# Patient Record
Sex: Female | Born: 1935 | Race: White | Hispanic: No | Marital: Married | Smoking: Never smoker
Health system: Southern US, Community
[De-identification: ages and names within clinical notes are randomized; demographics above are authoritative.]

## PROBLEM LIST (undated history)

## (undated) DIAGNOSIS — R1033 Periumbilical pain: Secondary | ICD-10-CM

## (undated) DIAGNOSIS — H3530 Unspecified macular degeneration: Secondary | ICD-10-CM

## (undated) DIAGNOSIS — N6019 Diffuse cystic mastopathy of unspecified breast: Secondary | ICD-10-CM

## (undated) DIAGNOSIS — F329 Major depressive disorder, single episode, unspecified: Secondary | ICD-10-CM

## (undated) DIAGNOSIS — M549 Dorsalgia, unspecified: Secondary | ICD-10-CM

## (undated) DIAGNOSIS — R000 Tachycardia, unspecified: Secondary | ICD-10-CM

## (undated) DIAGNOSIS — R42 Dizziness and giddiness: Secondary | ICD-10-CM

## (undated) DIAGNOSIS — C50919 Malignant neoplasm of unspecified site of unspecified female breast: Secondary | ICD-10-CM

## (undated) DIAGNOSIS — F3289 Other specified depressive episodes: Secondary | ICD-10-CM

## (undated) DIAGNOSIS — W010XXA Fall on same level from slipping, tripping and stumbling without subsequent striking against object, initial encounter: Secondary | ICD-10-CM

## (undated) DIAGNOSIS — K5900 Constipation, unspecified: Secondary | ICD-10-CM

## (undated) DIAGNOSIS — K2100 Gastro-esophageal reflux disease with esophagitis, without bleeding: Secondary | ICD-10-CM

## (undated) DIAGNOSIS — J439 Emphysema, unspecified: Secondary | ICD-10-CM

## (undated) DIAGNOSIS — R350 Frequency of micturition: Secondary | ICD-10-CM

## (undated) DIAGNOSIS — M67919 Unspecified disorder of synovium and tendon, unspecified shoulder: Secondary | ICD-10-CM

## (undated) DIAGNOSIS — M25569 Pain in unspecified knee: Secondary | ICD-10-CM

## (undated) DIAGNOSIS — E785 Hyperlipidemia, unspecified: Secondary | ICD-10-CM

## (undated) DIAGNOSIS — R5383 Other fatigue: Secondary | ICD-10-CM

## (undated) DIAGNOSIS — N289 Disorder of kidney and ureter, unspecified: Secondary | ICD-10-CM

## (undated) DIAGNOSIS — I10 Essential (primary) hypertension: Secondary | ICD-10-CM

## (undated) DIAGNOSIS — G479 Sleep disorder, unspecified: Secondary | ICD-10-CM

## (undated) DIAGNOSIS — R32 Unspecified urinary incontinence: Secondary | ICD-10-CM

## (undated) DIAGNOSIS — M719 Bursopathy, unspecified: Secondary | ICD-10-CM

## (undated) DIAGNOSIS — M8448XA Pathological fracture, other site, initial encounter for fracture: Secondary | ICD-10-CM

## (undated) DIAGNOSIS — R5381 Other malaise: Secondary | ICD-10-CM

## (undated) DIAGNOSIS — G309 Alzheimer's disease, unspecified: Secondary | ICD-10-CM

## (undated) DIAGNOSIS — M810 Age-related osteoporosis without current pathological fracture: Secondary | ICD-10-CM

## (undated) DIAGNOSIS — R269 Unspecified abnormalities of gait and mobility: Secondary | ICD-10-CM

## (undated) DIAGNOSIS — F0280 Dementia in other diseases classified elsewhere without behavioral disturbance: Secondary | ICD-10-CM

## (undated) DIAGNOSIS — F1096 Alcohol use, unspecified with alcohol-induced persisting amnestic disorder: Secondary | ICD-10-CM

## (undated) HISTORY — DX: Diffuse cystic mastopathy of unspecified breast: N60.19

## (undated) HISTORY — DX: Alcohol use, unspecified with alcohol-induced persisting amnestic disorder: F10.96

## (undated) HISTORY — DX: Dementia in other diseases classified elsewhere, unspecified severity, without behavioral disturbance, psychotic disturbance, mood disturbance, and anxiety: F02.80

## (undated) HISTORY — DX: Other fatigue: R53.81

## (undated) HISTORY — DX: Frequency of micturition: R35.0

## (undated) HISTORY — DX: Malignant neoplasm of unspecified site of unspecified female breast: C50.919

## (undated) HISTORY — DX: Dorsalgia, unspecified: M54.9

## (undated) HISTORY — DX: Sleep disorder, unspecified: G47.9

## (undated) HISTORY — DX: Other specified depressive episodes: F32.89

## (undated) HISTORY — DX: Bursopathy, unspecified: M71.9

## (undated) HISTORY — DX: Gastro-esophageal reflux disease with esophagitis: K21.0

## (undated) HISTORY — DX: Constipation, unspecified: K59.00

## (undated) HISTORY — DX: Major depressive disorder, single episode, unspecified: F32.9

## (undated) HISTORY — DX: Fall on same level from slipping, tripping and stumbling without subsequent striking against object, initial encounter: W01.0XXA

## (undated) HISTORY — DX: Tachycardia, unspecified: R00.0

## (undated) HISTORY — PX: OTHER SURGICAL HISTORY: SHX169

## (undated) HISTORY — DX: Unspecified macular degeneration: H35.30

## (undated) HISTORY — DX: Periumbilical pain: R10.33

## (undated) HISTORY — DX: Gastro-esophageal reflux disease with esophagitis, without bleeding: K21.00

## (undated) HISTORY — PX: ROTATOR CUFF REPAIR: SHX139

## (undated) HISTORY — DX: Essential (primary) hypertension: I10

## (undated) HISTORY — DX: Alzheimer's disease, unspecified: G30.9

## (undated) HISTORY — DX: Age-related osteoporosis without current pathological fracture: M81.0

## (undated) HISTORY — DX: Pain in unspecified knee: M25.569

## (undated) HISTORY — DX: Other fatigue: R53.83

## (undated) HISTORY — DX: Unspecified urinary incontinence: R32

## (undated) HISTORY — DX: Hyperlipidemia, unspecified: E78.5

## (undated) HISTORY — DX: Disorder of kidney and ureter, unspecified: N28.9

## (undated) HISTORY — DX: Dizziness and giddiness: R42

## (undated) HISTORY — DX: Unspecified abnormalities of gait and mobility: R26.9

## (undated) HISTORY — DX: Pathological fracture, other site, initial encounter for fracture: M84.48XA

## (undated) HISTORY — DX: Emphysema, unspecified: J43.9

## (undated) HISTORY — DX: Unspecified disorder of synovium and tendon, unspecified shoulder: M67.919

---

## 1997-06-28 ENCOUNTER — Ambulatory Visit (HOSPITAL_COMMUNITY): Admission: RE | Admit: 1997-06-28 | Discharge: 1997-06-28 | Payer: Self-pay | Admitting: *Deleted

## 1997-12-15 ENCOUNTER — Ambulatory Visit (HOSPITAL_COMMUNITY): Admission: RE | Admit: 1997-12-15 | Discharge: 1997-12-15 | Payer: Self-pay | Admitting: *Deleted

## 1998-06-10 ENCOUNTER — Ambulatory Visit (HOSPITAL_COMMUNITY): Admission: RE | Admit: 1998-06-10 | Discharge: 1998-06-10 | Payer: Self-pay | Admitting: *Deleted

## 1998-06-28 ENCOUNTER — Ambulatory Visit (HOSPITAL_COMMUNITY): Admission: RE | Admit: 1998-06-28 | Discharge: 1998-06-28 | Payer: Self-pay | Admitting: Obstetrics & Gynecology

## 1998-11-30 ENCOUNTER — Other Ambulatory Visit: Admission: RE | Admit: 1998-11-30 | Discharge: 1998-11-30 | Payer: Self-pay | Admitting: *Deleted

## 1998-12-19 ENCOUNTER — Encounter: Payer: Self-pay | Admitting: *Deleted

## 1998-12-19 ENCOUNTER — Ambulatory Visit (HOSPITAL_COMMUNITY): Admission: RE | Admit: 1998-12-19 | Discharge: 1998-12-19 | Payer: Self-pay | Admitting: *Deleted

## 1998-12-21 ENCOUNTER — Ambulatory Visit (HOSPITAL_COMMUNITY): Admission: RE | Admit: 1998-12-21 | Discharge: 1998-12-21 | Payer: Self-pay | Admitting: *Deleted

## 1998-12-21 ENCOUNTER — Encounter: Payer: Self-pay | Admitting: *Deleted

## 1998-12-27 ENCOUNTER — Encounter: Admission: RE | Admit: 1998-12-27 | Discharge: 1998-12-27 | Payer: Self-pay | Admitting: *Deleted

## 1999-03-20 HISTORY — PX: MASTECTOMY: SHX3

## 1999-03-27 ENCOUNTER — Ambulatory Visit (HOSPITAL_COMMUNITY): Admission: RE | Admit: 1999-03-27 | Discharge: 1999-03-27 | Payer: Self-pay | Admitting: *Deleted

## 1999-04-04 ENCOUNTER — Encounter: Admission: RE | Admit: 1999-04-04 | Discharge: 1999-05-10 | Payer: Self-pay | Admitting: Orthopaedic Surgery

## 1999-06-29 ENCOUNTER — Encounter: Payer: Self-pay | Admitting: *Deleted

## 1999-06-29 ENCOUNTER — Encounter: Admission: RE | Admit: 1999-06-29 | Discharge: 1999-06-29 | Payer: Self-pay | Admitting: Internal Medicine

## 1999-12-04 ENCOUNTER — Other Ambulatory Visit: Admission: RE | Admit: 1999-12-04 | Discharge: 1999-12-04 | Payer: Self-pay | Admitting: *Deleted

## 2000-01-01 ENCOUNTER — Encounter: Admission: RE | Admit: 2000-01-01 | Discharge: 2000-01-01 | Payer: Self-pay | Admitting: *Deleted

## 2000-01-01 ENCOUNTER — Encounter: Payer: Self-pay | Admitting: *Deleted

## 2000-01-15 ENCOUNTER — Encounter: Admission: RE | Admit: 2000-01-15 | Discharge: 2000-01-15 | Payer: Self-pay | Admitting: *Deleted

## 2000-01-15 ENCOUNTER — Encounter: Payer: Self-pay | Admitting: *Deleted

## 2000-01-15 ENCOUNTER — Other Ambulatory Visit: Admission: RE | Admit: 2000-01-15 | Discharge: 2000-01-15 | Payer: Self-pay | Admitting: *Deleted

## 2000-01-15 ENCOUNTER — Encounter (INDEPENDENT_AMBULATORY_CARE_PROVIDER_SITE_OTHER): Payer: Self-pay | Admitting: *Deleted

## 2000-01-29 ENCOUNTER — Encounter: Payer: Self-pay | Admitting: *Deleted

## 2000-01-31 ENCOUNTER — Encounter (INDEPENDENT_AMBULATORY_CARE_PROVIDER_SITE_OTHER): Payer: Self-pay | Admitting: Specialist

## 2000-02-01 ENCOUNTER — Inpatient Hospital Stay (HOSPITAL_COMMUNITY): Admission: EM | Admit: 2000-02-01 | Discharge: 2000-02-02 | Payer: Self-pay | Admitting: *Deleted

## 2000-03-05 ENCOUNTER — Observation Stay (HOSPITAL_COMMUNITY): Admission: RE | Admit: 2000-03-05 | Discharge: 2000-03-06 | Payer: Self-pay | Admitting: *Deleted

## 2000-03-05 ENCOUNTER — Encounter (INDEPENDENT_AMBULATORY_CARE_PROVIDER_SITE_OTHER): Payer: Self-pay

## 2000-06-26 ENCOUNTER — Ambulatory Visit (HOSPITAL_COMMUNITY): Admission: RE | Admit: 2000-06-26 | Discharge: 2000-06-26 | Payer: Self-pay | Admitting: Gastroenterology

## 2000-10-02 ENCOUNTER — Encounter: Admission: RE | Admit: 2000-10-02 | Discharge: 2000-10-16 | Payer: Self-pay | Admitting: Oncology

## 2000-10-17 ENCOUNTER — Encounter: Admission: RE | Admit: 2000-10-17 | Discharge: 2000-10-23 | Payer: Self-pay | Admitting: Oncology

## 2000-12-02 ENCOUNTER — Encounter: Admission: RE | Admit: 2000-12-02 | Discharge: 2000-12-02 | Payer: Self-pay | Admitting: Oncology

## 2000-12-02 ENCOUNTER — Encounter: Payer: Self-pay | Admitting: Oncology

## 2000-12-23 ENCOUNTER — Other Ambulatory Visit: Admission: RE | Admit: 2000-12-23 | Discharge: 2000-12-23 | Payer: Self-pay | Admitting: *Deleted

## 2001-12-03 ENCOUNTER — Encounter: Admission: RE | Admit: 2001-12-03 | Discharge: 2001-12-03 | Payer: Self-pay | Admitting: Oncology

## 2001-12-03 ENCOUNTER — Encounter: Payer: Self-pay | Admitting: Oncology

## 2002-01-09 ENCOUNTER — Other Ambulatory Visit: Admission: RE | Admit: 2002-01-09 | Discharge: 2002-01-09 | Payer: Self-pay | Admitting: *Deleted

## 2003-01-21 ENCOUNTER — Other Ambulatory Visit: Admission: RE | Admit: 2003-01-21 | Discharge: 2003-01-21 | Payer: Self-pay | Admitting: *Deleted

## 2003-10-11 ENCOUNTER — Emergency Department (HOSPITAL_COMMUNITY): Admission: EM | Admit: 2003-10-11 | Discharge: 2003-10-11 | Payer: Self-pay | Admitting: Family Medicine

## 2003-10-20 ENCOUNTER — Emergency Department (HOSPITAL_COMMUNITY): Admission: EM | Admit: 2003-10-20 | Discharge: 2003-10-20 | Payer: Self-pay

## 2003-12-06 ENCOUNTER — Encounter: Admission: RE | Admit: 2003-12-06 | Discharge: 2003-12-06 | Payer: Self-pay | Admitting: Oncology

## 2003-12-14 ENCOUNTER — Ambulatory Visit (HOSPITAL_COMMUNITY): Admission: RE | Admit: 2003-12-14 | Discharge: 2003-12-14 | Payer: Self-pay | Admitting: Oncology

## 2004-03-29 ENCOUNTER — Ambulatory Visit: Payer: Self-pay | Admitting: Oncology

## 2004-04-03 ENCOUNTER — Ambulatory Visit (HOSPITAL_COMMUNITY): Admission: RE | Admit: 2004-04-03 | Discharge: 2004-04-03 | Payer: Self-pay | Admitting: Oncology

## 2004-09-26 ENCOUNTER — Ambulatory Visit: Payer: Self-pay | Admitting: Oncology

## 2004-09-29 ENCOUNTER — Ambulatory Visit (HOSPITAL_COMMUNITY): Admission: RE | Admit: 2004-09-29 | Discharge: 2004-09-29 | Payer: Self-pay | Admitting: Oncology

## 2005-01-19 ENCOUNTER — Ambulatory Visit: Payer: Self-pay | Admitting: Oncology

## 2005-01-31 ENCOUNTER — Ambulatory Visit (HOSPITAL_COMMUNITY): Admission: RE | Admit: 2005-01-31 | Discharge: 2005-01-31 | Payer: Self-pay | Admitting: Oncology

## 2005-06-13 ENCOUNTER — Ambulatory Visit (HOSPITAL_COMMUNITY): Admission: RE | Admit: 2005-06-13 | Discharge: 2005-06-13 | Payer: Self-pay | Admitting: Internal Medicine

## 2005-06-14 ENCOUNTER — Ambulatory Visit: Admission: RE | Admit: 2005-06-14 | Discharge: 2005-06-14 | Payer: Self-pay | Admitting: Internal Medicine

## 2005-10-04 ENCOUNTER — Other Ambulatory Visit: Admission: RE | Admit: 2005-10-04 | Discharge: 2005-10-04 | Payer: Self-pay | Admitting: Obstetrics & Gynecology

## 2006-02-01 ENCOUNTER — Ambulatory Visit (HOSPITAL_COMMUNITY): Admission: RE | Admit: 2006-02-01 | Discharge: 2006-02-01 | Payer: Self-pay | Admitting: Pediatrics

## 2006-02-18 ENCOUNTER — Ambulatory Visit (HOSPITAL_COMMUNITY): Admission: RE | Admit: 2006-02-18 | Discharge: 2006-02-18 | Payer: Self-pay | Admitting: Pediatrics

## 2008-05-13 ENCOUNTER — Ambulatory Visit: Payer: Self-pay | Admitting: Oncology

## 2008-05-17 LAB — CBC WITH DIFFERENTIAL/PLATELET
BASO%: 0.3 % (ref 0.0–2.0)
Basophils Absolute: 0 10*3/uL (ref 0.0–0.1)
EOS%: 1.5 % (ref 0.0–7.0)
Eosinophils Absolute: 0.1 10*3/uL (ref 0.0–0.5)
MONO#: 0.6 10*3/uL (ref 0.1–0.9)
MONO%: 7.1 % (ref 0.0–14.0)
NEUT%: 76 % (ref 38.4–76.8)
RDW: 12.8 % (ref 11.2–14.5)
WBC: 8.4 10*3/uL (ref 3.9–10.3)

## 2008-05-17 LAB — COMPREHENSIVE METABOLIC PANEL
Albumin: 4.4 g/dL (ref 3.5–5.2)
Alkaline Phosphatase: 56 U/L (ref 39–117)
CO2: 26 mEq/L (ref 19–32)
Calcium: 9.4 mg/dL (ref 8.4–10.5)
Glucose, Bld: 105 mg/dL — ABNORMAL HIGH (ref 70–99)
Total Protein: 6.7 g/dL (ref 6.0–8.3)

## 2008-11-01 ENCOUNTER — Ambulatory Visit (HOSPITAL_COMMUNITY): Admission: RE | Admit: 2008-11-01 | Discharge: 2008-11-01 | Payer: Self-pay | Admitting: Internal Medicine

## 2008-11-03 ENCOUNTER — Encounter: Admission: RE | Admit: 2008-11-03 | Discharge: 2008-11-03 | Payer: Self-pay | Admitting: Internal Medicine

## 2010-04-09 ENCOUNTER — Encounter: Payer: Self-pay | Admitting: Internal Medicine

## 2010-08-04 NOTE — Discharge Summary (Signed)
[facility]  Patient:    Shelby Rodriguez, [facility]                          MRN: [id number] Adm. Date:  01/31/00 Disc. Date: 02/02/00 Attending:  Donnie Coffin. Samuella Cota, M.D. CC:         Richard A. Jacky Kindle, M.D.  Andres Ege, M.D.  Valentino Hue. Magrinat, M.D.   Discharge Summary  CCS#:  [id number]  CHIEF COMPLAINT:  Multicentric ductal carcinoma in situ, left breast.  HISTORY OF PRESENT ILLNESS:  This 77 year old white female had her regular mammograms throughout the years. She had a mammogram of the left breast only on June 29, 1999. There were some calcifications in the left breast. There was no change from a study of December 19, 1998. She then had bilateral mammograms on January 01, 2000. They now describe a new area in the subareolar area with increased number in ______ medially. Dr. Doloris Hall recommended biopsy which could include imaging guided biopsy or an excisional biopsy. The patients had numerous cysts aspirated throughout the years by Dr. Judithe Modest. She has never had any breast biopsies. The patient had felt nothing new in her breast. Her menarche was at age 83, her first pregnancy at age 56. She breast fed her first child for 2-3 months. She went through menopause at about age 21 and has been on hormone replacement for 11 years. There is no family history of any breast malignancy. The patient underwent large core needle biopsy of 2 areas in the left breast and both of these showed ductal carcinoma in situ with no invasive carcinoma. It was felt that because this was multicentric that a total mastectomy was indicated since there was no evidence of invasive disease. There was no plan to do a sentinel lymph node biopsy. The patient is scheduled now for that surgery. The patient is not interested in breast reconstruction at that time.  PAST MEDICAL HISTORY:  ALLERGIES:  NOVOCAINE made her faint but no rash.  MEDICATIONS:  Premarin 0.625 mg q.d., Provera  2.5 mg q.d. which she has now discontinued since she was diagnosed with DCIS. Levatal 20 mg daily for hypertension, calcium 1500 mg q.d. plus vitamin D, vitamin E, B12, folic acid 1 coated aspirin 81 mg per day, also uses a stool softener.  PAST SURGICAL HISTORY:  Surgery on her left elbow, has also had bilateral carpal tunnel surgery, tonsillectomy at age 77.  SERIOUS ILLNESSES:  See above.  REVIEW OF SYSTEMS:  Wears glasses and is restrictive on drivers license. Does not smoke and has never smoked. There is no history of any heart disease. Bowel elimination usually once a day. Drinks an occasional alcoholic beverage. The patient has seen Dr. Darvin Neighbours in the past with about 1 urinary tract infection a year. She apparently has been cystoscoped and has nocturia x 3-4.  MARITAL HISTORY:  Her husband, Annette Stable, is 75 years old. They have 1 son and 1 daughter.  PHYSICAL EXAMINATION:  VITAL SIGNS:  Weight 127 pounds.  GENERAL:  The patient is a well-developed, well-nourished, pleasant, white female in no acute distress.  PERTINENT FINDINGS RELATED TO THE BREAST:  The left breast is moderate size, no definite mass felt in the breast although there are some fibrocystic changes in the upper outer quadrant. The patient does have a palpable left axillary lymph node which is nontender but freely moveable and probably 1.5 cm in size.  IMPRESSION:  Multicentric ductal carcinoma in situ,  left breast.  HOSPITAL COURSE:  The patient was taken to the operating room on January 31, 2000 at which time she had a left total mastectomy. There was at least 1 lymph node in the low axilla which was removed with the specimen. The patient tolerated the procedure well and was taken to the recovery room but shortly after reaching the recovery room, a large hematoma of the wound was noted and the patient was immediately taken back to the operating room where the hematoma was evacuated, the wound was copiously  irrigated and there was no bleeding site noted. The drains were replaced and the patient was placed in a pressure dressing with a 6 inch Ace bandage wrapped around her chest. Postoperatively, her hemoglobin was 9.9 with hematocrit 28 but the first postoperative day her hemoglobin was down to 7.6 with a hematocrit of 21.9. It was felt that she would probably continue to lose some blood and at this level she needed blood transfusions. This was discussed with the patient and she was then given 2 units of packed red blood cells. Her hemoglobin increased to 11.2 with hematocrit of 32.5 on the second postoperative day and she was discharged home improved. The patient was to be seen back in the office in 4-5 days.  Laboratory data revealed initial hemoglobin of 13.2, her last hemoglobin was 11.2. White count initially was 13,900 and her last was 8000. EKG showed normal sinus rhythm, normal EKG. Chest x-ray was unremarkable. Final pathology report showed multifocal invasive lobular carcinoma with multifocal ductal and lobular carcinoma in situ. She had 2 areas of invasive lobular carcinoma, one measuring 1.1 cm the other 0.3 cm. There was 1 lymph node 1.7 cm in size which was identified and there was no evidence of metastatic tumor in that lymph node. The patient was estrogen receptor positive, progestin receptor slightly positive, proliferative marker low at 2%. Her ______ was 3+ positive. The left breast carcinoma was strongly positive for estrogen receptor and weakly positive for progesterone receptor. The tumor is DNA diploid and has a low proliferation. There is strong over expression of her ______ detection.  OPERATIONS:  January 31, 2000, left total mastectomy.  POSTOPERATIVE COMPLICATIONS:  Bleeding. The patient was returned to the operating room on January 31, 2000 for evacuation of hematoma and control of bleeding.   CONDITION ON DISCHARGE:  Improved.  DISCHARGE MEDICATIONS:   Vicodin 1 p.o. q. 4h p.r.n., ferrous gluconate 300 mg p.o. b.i.d. for 15 days. The patient will be seen back in the office for follow-up in 4-5 days. DD:  02/19/00 TD:  02/19/00 Job: [id number]

## 2010-08-04 NOTE — Procedures (Signed)
[facility]  Patient:    Shelby Rodriguez, Shelby Rodriguez                        MRN: [id number] Proc. Date: 06/26/00 Adm. Date:  [id number] Attending:  Rich Brave CC:         Richard A. Jacky Kindle, M.D.             Valentino Hue. Magrinat, M.D.             Andres Ege, M.D.                           Procedure Report  PROCEDURE:  Colonoscopy.  INDICATION:  A 77 year old female with history of breast cancer, for colon cancer screening.  Her only symptom is chronic, stable constipation. FINDINGS:  Moderate diverticulosis and mild melanosis coli.  INFORMED CONSENT:  The nature, purpose, and risks of the procedure have been discussed with the patient who provided written consent.  SEDATION:  Fentanyl 50 mcg and Versed 4 mg IV without arrhythmias or desaturation.  DESCRIPTION OF PROCEDURE:  The Olympus adult video colonoscope was advanced through a somewhat fixated and tortuous sigmoid region to the cecum as identified by clear visualization of the appendiceal orifice, after which pullback was performed in a gradual fashion.  The quality of the prep was excellent, and it is felt that all areas were well seen.  No polyps, cancer, colitis, or vascular malformations were observed during this exam.  The patient did have moderate sigmoid diverticulosis with associated fixation of the colon, as well as an isolated right-sided diverticulum a short distance above the cecum.  There was also a mild amount of melanosis coli.  Retroflexion of the rectum was normal.  Pullout through the anal canal demonstrated mild to moderate internal hemorrhoids.  No biopsies were obtained.  The patient tolerated the procedure well, and there were no apparent complications.  IMPRESSION: 1. No evidence of neoplastic lesions. 2. Moderate diverticulosis. 3. Melanosis coli implying previous exposure to senna-type laxatives.  PLAN:  Flexible sigmoidoscopy in five years with  follow-up colonoscopy in about 10 years if the patient remains in good general medical health in the interim. DD:  06/26/00 TD:  06/26/00 Job: [id number]

## 2010-08-04 NOTE — Op Note (Signed)
. [facility]  Patient:    Shelby Rodriguez, [facility]                          MRN: [id number] Proc. Date: 01/31/00 Adm. Date:  [id number] Attending:  Kandis Mannan CC:         Richard A. Jacky Kindle, M.D.  Andres Ege, M.D.  Thomas B. Samuella Cota, M.D.   Operative Report  CCS# [id number]  PREOPERATIVE DIAGNOSIS:  Multicentric ductal carcinoma in situ, left breast.  POSTOPERATIVE DIAGNOSIS:  Multicentric ductal carcinoma in situ, left breast.  OPERATION PERFORMED:  Left total mastectomy.  SURGEON:  Maisie Fus B. Samuella Cota, M.D.  ASSISTANT:  Gita Kudo, M.D.  ANESTHESIA:  General.  ANESTHESIOLOGIST:  Dr. Almeta Monas and CRNA.  DESCRIPTION OF PROCEDURE:  The patient was taken to the operating room and placed on the table in supine position.  After satisfactory general anesthetic with intubation, the left breast and axilla was prepped and draped as a sterile field.  A transverse elliptical incision was outlined so as to remove the nipple and areola and also the site of the two large core needle biopsies in the lower portion of the breast.  The incision was started about 4 cm lateral to the midline, extended to the anterior axillary line.  Skin flaps were then developed superiorly to the clavicle, inferiorly to the fascia overlying the rectus muscle, medially to the sternum and laterally to the latissimus dorsi muscle.  After the skin flaps were developed, the breast was removed from medial to lateral, removing the breast but leaving the pectoralis major and minor muscles.  The lower portion of the axilla was cut across and at least one lymph node which felt benign was in the specimen.  No attempt ws made to identify the long thoracic nerve or the thoracodorsal artery, vein and nerve.  The wound was copiously irrigated.  Hemostasis was obtained using the cautery or with ties of 3-0 Vicryl.  Two #10 round Blake drains were placed through separate stab wounds inferior  to the transverse incision.  One was placed in the axilla and one was placed over the chest wall medially.  They were sutured to the skin using 3-0 nylon.  The subcutaneous tissues were then approximated with interrupted sutures of 3-0 Vicryl and the skin was closed with skin staples.  Vaseline gauze was placed over the suture line and around the drains.  The Blake drains were connected to bulb suction.  The dressing was completed using 4 x 4s, ABDs and 4-inch Hypafix.  The patient seemed to tolerate the procedure well and was taken to the PACU in satisfactory condition. DD:  01/31/00 TD:  01/31/00 Job: [id number]

## 2010-08-04 NOTE — Op Note (Signed)
[facility]  Patient:    Shelby Rodriguez, Shelby Rodriguez                          MRN: [id number] Proc. Date: 03/05/00 Adm. Date:  [id number] Attending:  Kandis Mannan CC:         Richard A. Jacky Kindle, M.D.  Andres Ege, M.D.  Valentino Hue. Magrinat, M.D.   Operative Report  CCS NUMBER: [id number]  PREOPERATIVE DIAGNOSIS: Multicentric invasive lobular carcinoma, left breast.  POSTOPERATIVE DIAGNOSIS: Multicentric invasive lobular carcinoma, left breast.  OPERATION: 1. Axillary lymph node dissection. 2. Right total mastectomy.  SURGEON:  Maisie Fus B. Samuella Cota, M.D.  ASSISTANT:  Zigmund Daniel, M.D.  ANESTHESIA:  General.  ANESTHESIOLOGIST:  Mack Guise, M.D. and CRNA  DESCRIPTION OF PROCEDURE:  The patient was taken to the operating room and placed on the table in supine position.  After satisfactory general anesthesia with LMA intubation, the left chest and axilla and the right breast and axilla were prepped and draped in a sterile field.  The patient had a previous left total mastectomy, and the old incision was extended towards the axilla.  Skin flaps were developed and the axilla entered.  The patient had a seroma from her previous surgery, and it was a smooth-lined cyst.  The dissection revealed the axillary vein.  The patient did have some easily seen and palpable lymph nodes, but they felt fairly soft and hopefully were inflammatory nodes from the recent surgery.  The lateral thoracic nerve ______   and ______ artery, vein, and nerve were seen and preserved.  The vessels coming out inferior to the axillary vein were clipped and divided.  The axilla was dissected and the content removed.  The wound was irrigated, hemostasis obtained.  A #19 round Blake drain was placed through the previously made stab site laterally over the left chest.  This was placed in the axilla.  The skin was then closed with skin staples.  The drain was sutured to the skin using a  3-0 nylon.  The surgeons then closed gloves, and we used clean instruments on the right side.  A transverse elliptical incision was outlined for the total mastectomy.  The incision began about 4 cm from the midline, extended to the anterior axillary line.  Since the patient was not planning a reconstruction, a fairly generous amount of skin was removed.  Skin flaps were developed superior to the ______ medial to the sternum inferior to the place overlying the rectus muscle and lateral to the latissimus dorsi muscle.  The breast was then removed leaving the pectoralis major and minor muscles.  No attempt was made to dissect the axilla.  Bleeders were clamped and suture ligated with 3-0 Vicryl or cauterized with Bovie.  Two #19 round Blake drains were placed, one medially and one laterally.  These were sutured to the skin using 3-0 nylon. Subcutaneous tissue was closed with 3-0 Vicryl.  Skin was closed with skin staples.  All three drains were connected to bulb suction.  Vaseline gauze was placed over the suture line and around the drains; 4 x 4s, ABD, and 4 inch Hypafix were used to complete the dressing.  The patient seemed to tolerate the procedure well, although her blood pressure was running high during a good bit of the procedure.  The patient was taken to the PACU in satisfactory condition. DD:  03/05/00 TD:  03/06/00 Job: [id number]

## 2010-08-04 NOTE — Op Note (Signed)
[facility]  Patient:    Shelby Rodriguez, Shelby Rodriguez [facility]                          MRN: [id number] Proc. Date: 01/31/00 Adm. Date:  [id number] Attending:  Kandis Mannan                           Operative Report  CCS NUMBER:  [phone]  PREOPERATIVE DIAGNOSIS:  Postoperative hematoma of wound.  POSTOPERATIVE DIAGNOSIS:  Postoperative hematoma of wound.  OPERATION:  Exploration of wound and evacuation of hematoma.  SURGEON:  Maisie Fus B. Samuella Cota, M.D.  ASSISTANT:  Lorne Skeens. Hoxworth, M.D.  ANESTHESIA:  General.  ANESTHESIOLOGIST:  CRNA.  DESCRIPTION OF PROCEDURE:  The patient was taken to the operating room and placed on the table in the supine position.  After satisfactory general anesthetic with intubation, the left chest and breast area was prepped after the two Blake tubes had been removed.  The area was prepped widely.  The skin staples were removed and the subcuticular 3-0 Vicryl sutures removed.  The patient had a large hematoma of the wound which was evacuated.  The exploration revealed no obvious large bleeding vessel.  There were several very small areas which seemed to be oozing somewhat.  It was copiously irrigated with saline.  After watching the wound for quite some time, no obvious bleeding was noted. The patient had two more #18 Blake drains placed through the same stab wounds in the inferior part of the chest.  The drains were sutured to the skin using 3-0 nylon.  The skin was then closed with skin staples.  Vaseline gauze was placed over the suture line along with the drain sites.  They were connected to bulb suction.  The patients chest was then wrapped with a 6 inch Ace bandage to get more pressure on the wound.  The patient seemed to tolerate the procedure well and was taken to the PACU in satisfactory condition. DD:  01/31/00 TD:  01/31/00 Job: [id number]

## 2011-04-30 ENCOUNTER — Encounter: Payer: Self-pay | Admitting: Gastroenterology

## 2011-05-16 ENCOUNTER — Ambulatory Visit: Payer: Self-pay | Admitting: Gastroenterology

## 2011-07-02 ENCOUNTER — Other Ambulatory Visit: Payer: Self-pay | Admitting: Internal Medicine

## 2011-07-06 ENCOUNTER — Ambulatory Visit
Admission: RE | Admit: 2011-07-06 | Discharge: 2011-07-06 | Disposition: A | Discharge status: Home / Self Care | Payer: Self-pay | Source: Ambulatory Visit | Attending: Internal Medicine | Admitting: Internal Medicine

## 2011-07-06 MED ORDER — IOHEXOL 300 MG/ML  SOLN
100.0000 mL | Freq: Once | INTRAMUSCULAR | Status: AC | PRN
  Administered 2011-07-06: 100 mL via INTRAVENOUS

## 2012-06-16 LAB — CBC AND DIFFERENTIAL
Hemoglobin: 12.4 g/dL (ref 12.0–16.0)
Platelets: 191 10*3/uL (ref 150–399)

## 2012-06-16 LAB — BASIC METABOLIC PANEL
BUN: 17 mg/dL (ref 4–21)
Creatinine: 1 mg/dL (ref 0.5–1.1)
Glucose: 85 mg/dL

## 2012-06-19 ENCOUNTER — Non-Acute Institutional Stay (SKILLED_NURSING_FACILITY): Payer: Medicare Other | Admitting: Nurse Practitioner

## 2012-06-19 DIAGNOSIS — D649 Anemia, unspecified: Secondary | ICD-10-CM

## 2012-06-19 DIAGNOSIS — F0280 Dementia in other diseases classified elsewhere without behavioral disturbance: Secondary | ICD-10-CM

## 2012-06-19 DIAGNOSIS — I10 Essential (primary) hypertension: Secondary | ICD-10-CM

## 2012-06-19 DIAGNOSIS — K5900 Constipation, unspecified: Secondary | ICD-10-CM

## 2012-06-19 DIAGNOSIS — R634 Abnormal weight loss: Secondary | ICD-10-CM

## 2012-06-19 DIAGNOSIS — G309 Alzheimer's disease, unspecified: Secondary | ICD-10-CM

## 2012-06-19 DIAGNOSIS — E785 Hyperlipidemia, unspecified: Secondary | ICD-10-CM

## 2012-06-19 DIAGNOSIS — F329 Major depressive disorder, single episode, unspecified: Secondary | ICD-10-CM

## 2012-06-19 NOTE — Progress Notes (Signed)
Subjective:    Patient ID: Shelby Rodriguez, female    DOB: 12-12-1929, 79 y.o.   MRN: [id number]  HPI  HYPERLIPIDEMIA  LDL 144 02/25/12, takes Atorvastatin 20mg  ANEMIA  stable with  Hgb 12.5 03/10/12,  not on Iron or B12 DEPRESSIVE DISORDER NEC  stable on Mirtazapine 15mg  ALZHEIMER'S The Alzheimer's disease has not changed and the patient continues to function adequately in the current living environment with supervision. The patient has had little change in behavior.  No complications noted from the medication presently being used.Continues to progress despite Exelon and Namenda. Sees Dr. Sharene Skeans regularly.  MMSE 5 out of 30 on January 2012 per Dr. Constance Haw progress note. HTN UNSPECIFIED The blood pressure readings taken outside the office since the last visit have been in the target range.Takes Metoprolol 25mg  bid. 118/64 CONSTIPATION  stable on Senna WEIGHT LOSS, ABN  stabilized--on Zofran and Prilosec routinely for N/V per GI    Review of Systems  Constitutional: Negative for fever, chills, diaphoresis, appetite change, fatigue and unexpected weight change.  HENT: Positive for hearing loss. Negative for congestion, rhinorrhea, trouble swallowing, neck pain, voice change, postnasal drip and ear discharge.   Eyes: Negative.   Respiratory: Negative for cough, choking, chest tightness, shortness of breath and wheezing.   Cardiovascular: Negative for chest pain, palpitations and leg swelling.  Gastrointestinal: Negative for nausea, vomiting, abdominal pain, diarrhea, constipation and abdominal distention.  Endocrine: Negative for cold intolerance, heat intolerance, polydipsia, polyphagia and polyuria.  Genitourinary: Positive for frequency (urinary incontinence). Negative for dysuria, urgency, flank pain and pelvic pain.  Musculoskeletal: Positive for back pain, arthralgias and gait problem (walker). Negative for joint swelling.  Skin: Positive for rash. Negative for wound.   Allergic/Immunologic: Negative.   Neurological: Negative for tremors, seizures, syncope, speech difficulty, light-headedness, numbness and headaches.  Hematological: Negative.   Psychiatric/Behavioral: Positive for confusion. Negative for hallucinations, behavioral problems, sleep disturbance, dysphoric mood and agitation.       Objective:   Physical Exam  Constitutional: She appears well-developed and well-nourished.  HENT:  Head: Normocephalic and atraumatic.  Eyes: Conjunctivae and EOM are normal. Pupils are equal, round, and reactive to light.  Neck: Normal range of motion. Neck supple. No JVD present. No thyromegaly present.  Cardiovascular: Normal rate and regular rhythm.   No murmur heard. Pulmonary/Chest: Effort normal and breath sounds normal. No respiratory distress. She has no wheezes. She has no rales. She exhibits no tenderness.  Abdominal: Soft. Bowel sounds are normal. There is no tenderness.  Musculoskeletal: Normal range of motion. She exhibits no edema and no tenderness.  Lymphadenopathy:    She has no cervical adenopathy.  Neurological: She is alert. She has normal reflexes. She displays normal reflexes. No cranial nerve deficit. She exhibits normal muscle tone. Coordination normal.  Skin: Skin is warm and dry. No rash noted.  Psychiatric: Her mood appears not anxious. Her affect is inappropriate. Her affect is not angry and not blunt. Her speech is delayed and tangential. She is slowed. She is not agitated, not aggressive and not combative. Thought content is not paranoid and not delusional. Cognition and memory are impaired. She expresses impulsivity and inappropriate judgment. She does not exhibit a depressed mood. She exhibits abnormal recent memory and abnormal remote memory.     LABS REVIEWED:  10/01/11 CBC wbc 5.2 Hgb 12.3, Hct 36.7, plt 300   BMP Na 141, K 4.1, glucose 80, Bun 16, creatinine 1.40, Ca 9.8   TSH 1.545 11/26/11  lipid panel, cholesterol  315  Triglycerides 128 HDL 56 LDL 233 12/27/11 CBC wbc 5.2, Hgb 11.7, Hct 34.4, plt 252   CMP Na 142, K 4.7, glucose 63, Bun 22, creatinine 1.37, Ca 9.5, LFT wnl   TSH 1.331.  02/25/12  Lipid cholesterol 234, triglycerides 145, HDL 61, LDL 29 03/09/12  UA E-Coli treated with Cipro for total 10 days per urine culture.  03/10/12  CBC wbc 6.8, Hgb 12.5, Hct 38.5, plt 277   BMP Na 142, K 4.4, glucose 85, Bun 29, creatinine 1.49, Ca 10.0 03/27/12    UA enterococcus >100,000c/ml treated with Ampicillin.  04/06/12  UA no growth.  04/24/12   BMP Na 139, K 4.1, glucose 84, Bun 28, creatinine 1.33, Ca 9.4      Assessment & Plan:   . Other and unspecified hyperlipidemia  continue Atorvastatin 20mg    . Anemia, unspecified Update CBC  . Depressive disorder, not elsewhere classified Stable, continue Remeron 7.5mg    . Alzheimer's disease Progressing, check TSH  . Unspecified essential hypertension Controlled on Metoprolol   . Unspecified constipation Stable.   . Loss of weight  Stabilized.

## 2012-06-20 DIAGNOSIS — D649 Anemia, unspecified: Secondary | ICD-10-CM | POA: Insufficient documentation

## 2012-06-20 DIAGNOSIS — K5900 Constipation, unspecified: Secondary | ICD-10-CM | POA: Insufficient documentation

## 2012-06-20 DIAGNOSIS — R634 Abnormal weight loss: Secondary | ICD-10-CM | POA: Insufficient documentation

## 2012-06-20 DIAGNOSIS — F329 Major depressive disorder, single episode, unspecified: Secondary | ICD-10-CM | POA: Insufficient documentation

## 2012-06-20 DIAGNOSIS — F0280 Dementia in other diseases classified elsewhere without behavioral disturbance: Secondary | ICD-10-CM | POA: Insufficient documentation

## 2012-06-20 DIAGNOSIS — I10 Essential (primary) hypertension: Secondary | ICD-10-CM | POA: Insufficient documentation

## 2012-06-20 DIAGNOSIS — E785 Hyperlipidemia, unspecified: Secondary | ICD-10-CM | POA: Insufficient documentation

## 2012-06-23 LAB — CBC AND DIFFERENTIAL
HCT: 34 % — AB (ref 36–46)
Hemoglobin: 11.2 g/dL — AB (ref 12.0–16.0)
Platelets: 265 10*3/uL (ref 150–399)

## 2012-06-24 ENCOUNTER — Encounter: Payer: Self-pay | Admitting: Nurse Practitioner

## 2012-06-24 ENCOUNTER — Non-Acute Institutional Stay (SKILLED_NURSING_FACILITY): Payer: Medicare Other | Admitting: Nurse Practitioner

## 2012-06-24 DIAGNOSIS — K5900 Constipation, unspecified: Secondary | ICD-10-CM

## 2012-06-24 DIAGNOSIS — G309 Alzheimer's disease, unspecified: Secondary | ICD-10-CM

## 2012-06-24 DIAGNOSIS — F0280 Dementia in other diseases classified elsewhere without behavioral disturbance: Secondary | ICD-10-CM

## 2012-06-24 DIAGNOSIS — F329 Major depressive disorder, single episode, unspecified: Secondary | ICD-10-CM

## 2012-06-24 DIAGNOSIS — I10 Essential (primary) hypertension: Secondary | ICD-10-CM

## 2012-06-24 DIAGNOSIS — R000 Tachycardia, unspecified: Secondary | ICD-10-CM | POA: Insufficient documentation

## 2012-06-24 NOTE — Assessment & Plan Note (Signed)
Continue to f/u Neurology and Exelon 9.5mg /24hrs transdermal patch and Namenda 5mg  bid

## 2012-06-24 NOTE — Assessment & Plan Note (Signed)
Stable on Mirtazapine 15mg 

## 2012-06-24 NOTE — Progress Notes (Signed)
Patient ID: Shelby Rodriguez, female   DOB: Feb 28, 1930, 79 y.o.   MRN: [id number]  Chief Complaint: Chief Complaint  Patient presents with  . Medical Managment of Chronic Issues and c/o vaginal discharge.      HPI:   Staff reported the patient has greenish vaginal discharge 06/18/12 when assisted the patient with her incontinent care. No further vaginal discharge noted by other staff since then. The patient denied urogenital/vaginal area itching, pain, irritation, or dysuria today upon my visit.   Review of Systems:  Review of Systems  Constitutional: Negative for fever, chills, weight loss, malaise/fatigue and diaphoresis.  HENT: Negative for hearing loss, congestion, sore throat and neck pain.   Eyes: Negative for blurred vision, double vision, photophobia, pain, discharge and redness.  Respiratory: Negative for cough, sputum production, shortness of breath and wheezing.   Cardiovascular: Negative for chest pain, palpitations, orthopnea, claudication, leg swelling and PND.  Gastrointestinal: Positive for nausea (has been better since Zofran 4mg  with meals. ). Negative for heartburn, vomiting, abdominal pain, diarrhea and constipation.  Genitourinary: Positive for frequency (incontinent of bladder. ). Negative for dysuria, urgency and flank pain.  Musculoskeletal: Negative for back pain and joint pain.  Skin: Negative for rash.  Neurological: Negative for dizziness, tremors, focal weakness, seizures, loss of consciousness, weakness and headaches.  Endo/Heme/Allergies: Negative for environmental allergies and polydipsia. Does not bruise/bleed easily.  Psychiatric/Behavioral: Positive for memory loss. Negative for depression and hallucinations. The patient is not nervous/anxious and does not have insomnia.      Medications: Reviewed at [facility]   Physical Exam: Physical Exam  Constitutional: She appears well-developed and well-nourished. No distress.  HENT:  Head: Normocephalic and  atraumatic.  Eyes: EOM are normal. Pupils are equal, round, and reactive to light. Right eye exhibits no discharge. Left eye exhibits no discharge.  Neck: Normal range of motion. Neck supple. No JVD present. No thyromegaly present.  Cardiovascular: Normal rate and regular rhythm.   No murmur heard. Pulmonary/Chest: Effort normal and breath sounds normal. She has no wheezes. She has no rales.  Abdominal: Soft. Bowel sounds are normal. She exhibits no distension. There is no tenderness.  Genitourinary: Rectal exam shows no external hemorrhoid, no internal hemorrhoid, no fissure, no mass and no tenderness. Pelvic exam was performed with patient supine. No labial fusion. There is no rash, tenderness, lesion or injury on the right labia. There is no rash, tenderness, lesion or injury on the left labia. No erythema, tenderness or bleeding around the vagina. No foreign body around the vagina. No signs of injury around the vagina. No vaginal discharge found.  Lymphadenopathy:    She has no cervical adenopathy.       Right: No inguinal adenopathy present.       Left: No inguinal adenopathy present.  Neurological: She is alert. She displays normal reflexes. No cranial nerve deficit. She exhibits normal muscle tone. Coordination normal.  Skin: Skin is warm and dry. She is not diaphoretic.  Psychiatric: Her mood appears not anxious. Her affect is not angry, not blunt and not labile. Her speech is delayed and tangential. She is not agitated, not aggressive, not hyperactive, not slowed, not withdrawn, not actively hallucinating and not combative. Thought content is not paranoid and not delusional. Cognition and memory are impaired. She expresses impulsivity and inappropriate judgment. She does not exhibit a depressed mood. She exhibits abnormal recent memory and abnormal remote memory. She is attentive.     Filed Vitals:   06/24/12 1207  BP:  109/69  Pulse: 67  Temp: 98.5 F (36.9 C)  TempSrc: Tympanic   Resp: 18      Labs reviewed: Basic Metabolic Panel:  Recent Labs  78/29/56  NA 140  K 4.1  BUN 17  CREATININE 1.0  TSH 2.02    Liver Function Tests: No results found for this basename: AST, ALT, ALKPHOS, BILITOT, PROT, ALBUMIN,  in the last 8760 hours  CBC:  Recent Labs  06/16/12  WBC 7.1  HGB 12.4  HCT 36  PLT 191    Significant Diagnostic Results: 06/26/2011 CT abd and pelvis w cm: IMPRESSION:   1.  No acute findings identified within the abdomen or pelvis. 2.  Small pulmonary nodules in both lung bases are nonspecific. The largest measures 5.1 mm. If the patient is at high risk for bronchogenic carcinoma, follow-up chest CT at 6-12 months is recommended.  If the patient is at low risk for bronchogenic carcinoma, follow-up chest CT at 12 months is recommended.  This recommendation follows the consensus statement: Guidelines for Management of Small Pulmonary Nodules Detected on CT Scans: A Statement from the Fleischner Society as published in Radiology 2005; 237:395-400. 3.  T11 compression deformity as demonstrated on MRI from 11/03/2008.  This is stable from previous study.    Assessment/Plan Depressive disorder, not elsewhere classified Stable on Mirtazapine 15mg   Alzheimer's disease Continue to f/u Neurology and Exelon 9.5mg /24hrs transdermal patch and Namenda 5mg  bid  Unspecified essential hypertension Controlled on Metoprolol 25mg  bid.   Unspecified constipation Stable on Senna II bid.       Family/ staff Communication:  Fall risk precaution and intensive supervision    Goals of care: maintaining of current functional level.    Labs/tests ordered none

## 2012-06-24 NOTE — Assessment & Plan Note (Signed)
Controlled on Metoprolol 25mg bid.    

## 2012-06-24 NOTE — Assessment & Plan Note (Signed)
Stable on Senna II bid.

## 2012-07-03 ENCOUNTER — Non-Acute Institutional Stay (SKILLED_NURSING_FACILITY): Payer: Medicare Other | Admitting: Nurse Practitioner

## 2012-07-03 DIAGNOSIS — R609 Edema, unspecified: Secondary | ICD-10-CM

## 2012-07-03 DIAGNOSIS — G309 Alzheimer's disease, unspecified: Secondary | ICD-10-CM

## 2012-07-03 DIAGNOSIS — F329 Major depressive disorder, single episode, unspecified: Secondary | ICD-10-CM

## 2012-07-03 DIAGNOSIS — E785 Hyperlipidemia, unspecified: Secondary | ICD-10-CM

## 2012-07-03 DIAGNOSIS — K219 Gastro-esophageal reflux disease without esophagitis: Secondary | ICD-10-CM

## 2012-07-03 DIAGNOSIS — K5900 Constipation, unspecified: Secondary | ICD-10-CM

## 2012-07-03 NOTE — Assessment & Plan Note (Signed)
Trace, new, no cough, SOB, or sputum production, will weight daily.

## 2012-07-03 NOTE — Assessment & Plan Note (Signed)
Mirtazapine 15 mg

## 2012-07-03 NOTE — Assessment & Plan Note (Addendum)
Exelon 9.5mg/24hr daily and Namenda 5mg bid. Pacing more than prior.    

## 2012-07-03 NOTE — Assessment & Plan Note (Signed)
Senna S II bid. Stable.

## 2012-07-03 NOTE — Assessment & Plan Note (Signed)
Takes Atorvastatin 20mg  

## 2012-07-03 NOTE — Progress Notes (Signed)
Patient ID: Shelby Rodriguez, female   DOB: [birth year], 79 y.o.   MRN: [id number]  Chief Complaint:  Chief Complaint  Patient presents with  . Medical Managment of Chronic Issues    ankle edema     HPI:   Staff reported ankle edema, not apparent to me.  Problem List Items Addressed This Visit     ICD-9-CM   Other and unspecified hyperlipidemia     Takes Atorvastatin 20mg      Depressive disorder, not elsewhere classified     Mirtazapine 15mg     Alzheimer's disease     Exelon 9.5mg /24hr daily and Namenda 5mg  bid.    Unspecified constipation     Senna S II bid. Stable.     Edema - Primary     Trace, new, no cough, SOB, or sputum production, will weight daily.     GERD (gastroesophageal reflux disease)     Stable Omeprazole 20mg  and Zofran 4mg  tid.        Review of Systems:  Review of Systems  Constitutional: Negative for fever, chills, weight loss, malaise/fatigue and diaphoresis.  HENT: Negative for hearing loss, congestion, sore throat and neck pain.   Eyes: Negative for blurred vision, double vision, photophobia, pain, discharge and redness.  Respiratory: Negative for cough, sputum production, shortness of breath and wheezing.   Cardiovascular: Positive for leg swelling. Negative for chest pain, palpitations, orthopnea, claudication and PND.  Gastrointestinal: Positive for nausea (has been better since Zofran 4mg  with meals. ). Negative for heartburn, vomiting, abdominal pain, diarrhea and constipation.  Genitourinary: Positive for frequency (incontinent of bladder. ). Negative for dysuria, urgency and flank pain.  Musculoskeletal: Negative for back pain and joint pain.  Skin: Negative for rash.  Neurological: Negative for dizziness, tremors, focal weakness, seizures, loss of consciousness, weakness and headaches.  Endo/Heme/Allergies: Negative for environmental allergies and polydipsia. Does not bruise/bleed easily.  Psychiatric/Behavioral: Positive for memory loss.  Negative for depression and hallucinations. The patient is not nervous/anxious and does not have insomnia.      Medications: Patient's Medications   No medications on file     Physical Exam: Physical Exam  Constitutional: She appears well-developed and well-nourished. No distress.  HENT:  Head: Normocephalic and atraumatic.  Eyes: EOM are normal. Pupils are equal, round, and reactive to light. Right eye exhibits no discharge. Left eye exhibits no discharge.  Neck: Normal range of motion. Neck supple. No JVD present. No thyromegaly present.  Cardiovascular: Normal rate and regular rhythm.   No murmur heard. Pulmonary/Chest: Effort normal and breath sounds normal. She has no wheezes. She has no rales.  Abdominal: Soft. Bowel sounds are normal. She exhibits no distension. There is no tenderness.  Genitourinary: Rectal exam shows no external hemorrhoid, no internal hemorrhoid, no fissure, no mass and no tenderness. Pelvic exam was performed with patient supine. No labial fusion. There is no rash, tenderness, lesion or injury on the right labia. There is no rash, tenderness, lesion or injury on the left labia. No erythema, tenderness or bleeding around the vagina. No foreign body around the vagina. No signs of injury around the vagina. No vaginal discharge found.  Lymphadenopathy:    She has no cervical adenopathy.       Right: No inguinal adenopathy present.       Left: No inguinal adenopathy present.  Neurological: She is alert. She displays normal reflexes. No cranial nerve deficit. She exhibits normal muscle tone. Coordination normal.  Skin: Skin is warm and dry. She is  not diaphoretic.  Psychiatric: Her mood appears not anxious. Her affect is not angry, not blunt and not labile. Her speech is delayed and tangential. She is not agitated, not aggressive, not hyperactive, not slowed, not withdrawn, not actively hallucinating and not combative. Thought content is not paranoid and not  delusional. Cognition and memory are impaired. She expresses impulsivity and inappropriate judgment. She does not exhibit a depressed mood. She exhibits abnormal recent memory and abnormal remote memory. She is attentive.     Filed Vitals:   07/03/12 1708  BP: 144/80  Pulse: 68  Temp: 97.4 F (36.3 C)  TempSrc: Tympanic  Resp: 20      Labs reviewed: Basic Metabolic Panel:  Recent Labs  41/32/44 06/23/12  NA 140  --   K 4.1  --   BUN 17  --   CREATININE 1.0  --   TSH 2.02 1.00    Liver Function Tests: No results found for this basename: AST, ALT, ALKPHOS, BILITOT, PROT, ALBUMIN,  in the last 8760 hours  CBC:  Recent Labs  06/16/12 06/23/12  WBC 7.1 5.7  HGB 12.4 11.2*  HCT 36 34*  PLT 191 265    Anemia Panel: No results found for this basename: IRON, FOLATE, VITAMINB12,  in the last 8760 hours  Significant Diagnostic Results:     Assessment/Plan Edema Trace, new, no cough, SOB, or sputum production, will weight daily.   Other and unspecified hyperlipidemia Takes Atorvastatin 20mg    Depressive disorder, not elsewhere classified Mirtazapine 15mg   Alzheimer's disease Exelon 9.5mg /24hr daily and Namenda 5mg  bid.  GERD (gastroesophageal reflux disease) Stable Omeprazole 20mg  and Zofran 4mg  tid.   Unspecified constipation Senna S II bid. Stable.       Family/ staff Communication: safety, fell last night, pacing, lack of safety awareness.    Goals of care: SNF   Labs/tests ordered weight daily.

## 2012-07-03 NOTE — Assessment & Plan Note (Signed)
Stable Omeprazole 20mg and Zofran 4mg tid.        

## 2012-07-29 ENCOUNTER — Non-Acute Institutional Stay (SKILLED_NURSING_FACILITY): Payer: Medicare Other | Admitting: Nurse Practitioner

## 2012-07-29 DIAGNOSIS — F329 Major depressive disorder, single episode, unspecified: Secondary | ICD-10-CM

## 2012-07-29 DIAGNOSIS — F0280 Dementia in other diseases classified elsewhere without behavioral disturbance: Secondary | ICD-10-CM

## 2012-07-29 DIAGNOSIS — K219 Gastro-esophageal reflux disease without esophagitis: Secondary | ICD-10-CM

## 2012-07-29 DIAGNOSIS — R000 Tachycardia, unspecified: Secondary | ICD-10-CM

## 2012-07-29 DIAGNOSIS — E785 Hyperlipidemia, unspecified: Secondary | ICD-10-CM

## 2012-07-29 DIAGNOSIS — Z66 Do not resuscitate: Secondary | ICD-10-CM

## 2012-07-29 DIAGNOSIS — I10 Essential (primary) hypertension: Secondary | ICD-10-CM

## 2012-07-29 NOTE — Assessment & Plan Note (Signed)
Rate controlled on BCB

## 2012-07-29 NOTE — Assessment & Plan Note (Signed)
Mirtazapine 15mg  and she is stable.

## 2012-07-29 NOTE — Assessment & Plan Note (Signed)
Takes Atorvastatin 20mg  

## 2012-07-29 NOTE — Assessment & Plan Note (Signed)
Stable Omeprazole 20mg  and Zofran 4mg  tid.

## 2012-07-29 NOTE — Progress Notes (Signed)
Patient ID: Shelby Rodriguez, female   DOB: 1930-02-15, 79 y.o.   MRN: [id number]  Chief Complaint:  Chief Complaint  Patient presents with  . Medical Managment of Chronic Issues     HPI:   Staff reported the patient has grimaces when she walks--the patient stated she has no pain in her back or legs, Tylenol prn is available to her.  Problem List Items Addressed This Visit     ICD-9-CM   Other and unspecified hyperlipidemia     Takes Atorvastatin 20mg        Depressive disorder, not elsewhere classified     Mirtazapine 15mg  and she is stable.       Alzheimer's disease     Exelon 9.5mg /24hr daily and Namenda 5mg  bid. Pacing more than prior.       Unspecified essential hypertension     Controlled on Metoprolol 25mg  bid.       Tachycardia, unspecified     Rate controlled on BCB    GERD (gastroesophageal reflux disease)     Stable Omeprazole 20mg  and Zofran 4mg  tid.       DNR (do not resuscitate) - Primary      Review of Systems:  Review of Systems  Constitutional: Negative for fever, chills, weight loss, malaise/fatigue and diaphoresis.  HENT: Negative for hearing loss, congestion, sore throat and neck pain.   Eyes: Negative for blurred vision, double vision, photophobia, pain, discharge and redness.  Respiratory: Negative for cough, sputum production, shortness of breath and wheezing.   Cardiovascular: Positive for leg swelling. Negative for chest pain, palpitations, orthopnea, claudication and PND.  Gastrointestinal: Positive for nausea (has been better since Zofran 4mg  with meals. ). Negative for heartburn, vomiting, abdominal pain, diarrhea and constipation.  Genitourinary: Positive for frequency (incontinent of bladder. ). Negative for dysuria, urgency and flank pain.  Musculoskeletal: Negative for back pain and joint pain.  Skin: Negative for rash.  Neurological: Negative for dizziness, tremors, focal weakness, seizures, loss of consciousness, weakness and  headaches.  Endo/Heme/Allergies: Negative for environmental allergies and polydipsia. Does not bruise/bleed easily.  Psychiatric/Behavioral: Positive for memory loss. Negative for depression and hallucinations. The patient is not nervous/anxious and does not have insomnia.      Medications: Reviewed at [facility]   Physical Exam:Physical Exam  Constitutional: She appears well-developed and well-nourished. No distress.  HENT:  Head: Normocephalic and atraumatic.  Eyes: EOM are normal. Pupils are equal, round, and reactive to light. Right eye exhibits no discharge. Left eye exhibits no discharge.  Neck: Normal range of motion. Neck supple. No JVD present. No thyromegaly present.  Cardiovascular: Normal rate and regular rhythm.   No murmur heard. Pulmonary/Chest: Effort normal and breath sounds normal. She has no wheezes. She has no rales.  Abdominal: Soft. Bowel sounds are normal. She exhibits no distension. There is no tenderness.  Genitourinary: Rectal exam shows no external hemorrhoid, no internal hemorrhoid, no fissure, no mass and no tenderness. Pelvic exam was performed with patient supine. No labial fusion. There is no rash, tenderness, lesion or injury on the right labia. There is no rash, tenderness, lesion or injury on the left labia. No erythema, tenderness or bleeding around the vagina. No foreign body around the vagina. No signs of injury around the vagina. No vaginal discharge found.  Lymphadenopathy:    She has no cervical adenopathy.       Right: No inguinal adenopathy present.       Left: No inguinal adenopathy present.  Neurological: She is  alert. She displays normal reflexes. No cranial nerve deficit. She exhibits normal muscle tone. Coordination normal.  Skin: Skin is warm and dry. She is not diaphoretic.  Psychiatric: Her mood appears not anxious. Her affect is not angry, not blunt and not labile. Her speech is delayed and tangential. She is not agitated, not aggressive, not  hyperactive, not slowed, not withdrawn, not actively hallucinating and not combative. Thought content is not paranoid and not delusional. Cognition and memory are impaired. She expresses impulsivity and inappropriate judgment. She does not exhibit a depressed mood. She exhibits abnormal recent memory and abnormal remote memory. She is attentive.     Filed Vitals:   07/29/12 1347  BP: 128/72  Pulse: 88  Temp: 98.3 F (36.8 C)  TempSrc: Tympanic  Resp: 20      Labs reviewed: Basic Metabolic Panel:  Recent Labs  16/10/96 06/23/12  NA 140  --   K 4.1  --   BUN 17  --   CREATININE 1.0  --   TSH 2.02 1.00    Liver Function Tests: No results found for this basename: AST, ALT, ALKPHOS, BILITOT, PROT, ALBUMIN,  in the last 8760 hours  CBC:  Recent Labs  06/16/12 06/23/12  WBC 7.1 5.7  HGB 12.4 11.2*  HCT 36 34*  PLT 191 265    Anemia Panel: No results found for this basename: IRON, FOLATE, VITAMINB12,  in the last 8760 hours  Significant Diagnostic Results:     Assessment/Plan Alzheimer's disease Exelon 9.5mg /24hr daily and Namenda 5mg  bid. Pacing more than prior.     Other and unspecified hyperlipidemia Takes Atorvastatin 20mg      GERD (gastroesophageal reflux disease) Stable Omeprazole 20mg  and Zofran 4mg  tid.     Unspecified essential hypertension Controlled on Metoprolol 25mg  bid.     Tachycardia, unspecified Rate controlled on BCB  Depressive disorder, not elsewhere classified Mirtazapine 15mg  and she is stable.         Family/ staff Communication: safety   Goals of care: SNF   Labs/tests ordered none   2

## 2012-07-29 NOTE — Assessment & Plan Note (Signed)
Controlled on Metoprolol 25mg bid.    

## 2012-07-29 NOTE — Assessment & Plan Note (Signed)
Exelon 9.5mg /24hr daily and Namenda 5mg  bid. Pacing more than prior.

## 2012-08-07 DIAGNOSIS — R488 Other symbolic dysfunctions: Secondary | ICD-10-CM | POA: Insufficient documentation

## 2012-08-07 DIAGNOSIS — R1115 Cyclical vomiting syndrome unrelated to migraine: Secondary | ICD-10-CM | POA: Insufficient documentation

## 2012-08-07 DIAGNOSIS — R269 Unspecified abnormalities of gait and mobility: Secondary | ICD-10-CM | POA: Insufficient documentation

## 2012-08-07 DIAGNOSIS — R4701 Aphasia: Secondary | ICD-10-CM | POA: Insufficient documentation

## 2012-08-22 ENCOUNTER — Encounter: Payer: Self-pay | Admitting: Pediatrics

## 2012-08-22 ENCOUNTER — Encounter: Payer: Self-pay | Admitting: Nurse Practitioner

## 2012-08-22 ENCOUNTER — Ambulatory Visit (INDEPENDENT_AMBULATORY_CARE_PROVIDER_SITE_OTHER): Payer: Medicare Other | Admitting: Pediatrics

## 2012-08-22 VITALS — BP 110/70 | HR 78 | Ht 60.0 in

## 2012-08-22 DIAGNOSIS — G309 Alzheimer's disease, unspecified: Secondary | ICD-10-CM

## 2012-08-22 DIAGNOSIS — R269 Unspecified abnormalities of gait and mobility: Secondary | ICD-10-CM

## 2012-08-22 DIAGNOSIS — R4701 Aphasia: Secondary | ICD-10-CM

## 2012-08-22 DIAGNOSIS — R488 Other symbolic dysfunctions: Secondary | ICD-10-CM

## 2012-08-22 DIAGNOSIS — F0280 Dementia in other diseases classified elsewhere without behavioral disturbance: Secondary | ICD-10-CM

## 2012-08-22 NOTE — Progress Notes (Signed)
Patient: Shelby Rodriguez MRN: [id number] Sex: female DOB: 1929-09-19  Provider: Deetta Perla, MD Location of Care: [facility]  Note type: Routine return visit  History of Present Illness: Referral Source: Dr. Lenon Curt. Green III History from: daughter and [facility] chart Chief Complaint: Alzheimer's Disease   Shelby Rodriguez is a 79 y.o. female who returns for evaluation of progressive Alzheimer's disease.  She is here today with her daughter on wheelchair.  She is not able to walk for more than a few steps because she has an apraxic gait.  Her daughter feels that she still recognizes her, but she is unable to call her by name.    She has problems with sundowning that begin around 3 in the afternoon and continuing until 7 PM..  Daughter reports generally she is sleeping soundly between 7 p.m. and 6:30 a.m.  She had four episodes of falling since I last saw her.  Usually this occurred when she was trying to sit quickly and was not careful about the position of her body in relationship to the surface that where she wanted to sit.    Her appetite has been variable throughout this course.  It has been declining, but her weight is stable.  She can finger feed, but is unable to feed herself with a spoon or fork.  She needs some help with drinking.  She expresses a great deal of frustration and sadness that has not been combative.  Her daughter has paid for a sitter between 11 a.m. and 7 p.m. four days a week.  The other three days her daughter stays with her mother.  She is on the list at the memory unit at [facility] [place].  She has been seen by the committee from [facility].  Their major concern is her safety.  Apparently, there are only 12 beds in this unit, which is under high demand.  I have been asked to write a letter on her behalf, which I will do.  Review of Systems: 12 system review was remarkable for fatigue, hearing loss, consipation, incontinence, easy  brusing, feeling cold, increased thirst, joint pain, aching muscles, runny nose, skin sensitivity, memory loss, confusion, weakness, depression, anxiety, decreased energy, disinterest in past activities and hallucinations.  Past Medical History  Diagnosis Date  . Unspecified disorder of kidney and ureter   . Abdominal pain, periumbilic   . Tachycardia, unspecified   . Sleep disturbance, unspecified   . Tachycardia, unspecified   . Reflux esophagitis   . Unspecified constipation   . Abnormality of gait   . Unspecified urinary incontinence   . Pathologic fracture of vertebrae   . Fall from other slipping, tripping, or stumbling   . Backache, unspecified   . Emphysematous bleb   . Alcohol-induced persisting amnestic disorder   . Malignant neoplasm of breast (female), unspecified site   . Depressive disorder, not elsewhere classified   . Diffuse cystic mastopathy   . Disorders of bursae and tendons in shoulder region, unspecified   . Other and unspecified hyperlipidemia   . Macular degeneration (senile) of retina, unspecified   . Unspecified essential hypertension   . Osteoporosis, unspecified   . Dizziness and giddiness   . Other malaise and fatigue   . Urinary frequency   . Pain in joint, lower leg   . Alzheimer's disease    Hospitalizations: yes, Head Injury: no, Nervous System Infections: no, Immunizations up to date: yes Past Medical History Comments: A diagnosis  of dementia was made in November of 2007.  She represented with concerns of difficulty using language.  She had word locking, substitutions and paraphasias, and difficulty completing the sentence.  Prior to that she had been in an extremely verbally effective person.    In March of 2007, this was investigated with an MRI scan of the brain for problems with dizziness, memory loss, and speech disturbance.  There was evidence of atrophy and small vessel disease.  Carotid Doppler showed mild plaque in the common and internal  carotid arteries.  The patient had an elevated LDL of 253, but HDL was 54.  The patient had normal thyroid functions.  She was placed on Lexapro because of her symptoms of depression.    She had a family history of dementia in her father who died at age 35 after a long decline.  Her mother died at age 67 of Parkinson disease and also may have had dementia.  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  She had a remote use of tobacco and alcohol (beer).  She also drank two to three cups of coffee per day.  She had been stressed by her husband's left-brain stroke, from which he recovered well.  She had brought him to see me for a number of years because of perceived cognitive decline.  The patient had a Mini-Mental status of 23/30.  She had difficulty in orientation, had difficulty with serial substractions and spelling the world back with a word, "world" backwards.  She had poor intermediate recall, difficulty writing a sentence, problems drawing, and intersecting parallelogram.  An animal naming, she could only name four animals in a minute.  She had a nonfocal neurological examination.  EEG on March 07, 2006, showed a doninant frequency of 8 Hz.  However, she had significant background slowing that was moderate-to-severe, and diffuse.  Positron emission tomography showed decreased radio tracer in the temporal lobes and parietal lobes with preservation of activity in the frontal lobes, occipital cortex and cerebellar cortex.  This was thought to be consistent with the pattern seen in Alzheimer disease.    Laboratories were repeated, which failed to show underlying treatable causes of dementia.  The patient was placed first on Exelon patch.  She had some problems with contact dermatitis and nausea, but these subsided.  She then started on Namenda.  She has had a long steady decline that began first in areas of  language that has progressed in following area of dyspraxia in activities of daily living, gait, and severe memory dysfunction.  Behavior History none  Surgical History Past Surgical History  Procedure Laterality Date  . Rotator cuff repair    . Mastectomy Bilateral 2001  . Tennis elbow repair    . Aspiration of recurrent breast cysts Bilateral [phone]   Family History family history includes Alzheimer's disease in her father and Parkinsonism in her mother. Family History is negative migraines, seizures, cognitive impairment, blindness, deafness, birth defects, chromosomal disorder, autism.  Social History History   Social History  . Marital Status: Married    Spouse Name: N/A    Number of Children: N/A  . Years of Education: N/A   Social History Main Topics  . Smoking status: Never Smoker   . Smokeless tobacco: Never Used  . Alcohol Use: No  . Drug Use: No  . Sexually Active: None   Other Topics Concern  .  None   Social History Narrative  . None   Living with nursing facility  [facility]  No current outpatient prescriptions on file prior to visit.   No current facility-administered medications on file prior to visit.   The medication list was reviewed and reconciled. All changes or newly prescribed medications were explained.  A complete medication list was provided to the patient/caregiver.  No Known Allergies  Physical Exam BP 110/70  Pulse 78  Ht 5' (1.524 m)  Unable to measure weight  General: alert, well developed,  well-dressed, thin, in no acute distress, right-handed Head: normocephalic, no dysmorphic features Ears, Nose and Throat: Otoscopic: tympanic membranes normal; .Pharynx: oropharynx is pink without exudates or tonsillar hypertrophy. Neck: supple, full range of motion, no cranial or cervical bruits Respiratory: auscultation clear Cardiovascular: no murmurs, pulses are normal Musculoskeletal: no skeletal deformities or apparent  scoliosis Skin:  I did not see inflammation of her skin by her patches Trunk:  no deformities in her limbs  Neurologic Exam  Mental Status: alert; Patient  is unable to name objects or repeat phrases, she understands some of what I ask of her, but less than last time. Her dysphasia is nonfluent. She shows some signs of dyspraxia  during the examination.  She had a flat affect today.  She had more trouble following commands than she has in the past. Cranial Nerves: visual fields are full to double simultaneous stimuli; extraocular movements are full and conjugate; pupils are round reactive to light; funduscopic examination shows sharp disc margins with normal vessels;  right eyelid ptosis, symmetric facial strength; midline tongue and uvula; air conduction is greater than bone conduction bilaterally. Motor: Normal functional strength, tone, and mass; apraxic motor movements; no pronator drift. Sensory: intact responses to cold, vibration Coordination: good finger-to-nose, slow rapid repetitive alternating movements and poor finger apposition Gait and Station:   gait not tested today;  she was sitting in the wheelchair  and has difficulty walking without assistance, she is able to propel the wheelchair with her feet Reflexes: symmetric,  brisk at the knees and biceps and diminished bilaterally; no clonus; bilateral flexor plantar responses. no frontal release signs were seen  Assessment 1. Alzheimer's dementia (331.0). 2. Aphasia (784.3). 3. 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  I have a long since stopped testing her mental status when she reached 3/30 MMSE on June 27, 2010.  Animal naming: 1, and clock drawing: 1 on the same day.  On her last visit, she was in a wheelchair, but was able to get up and bare weight on her legs by history.  I did not test it at that time.  She is basically wheelchair bound at this time  except for transfers.  Plan Continue Namenda and Exelon until such time as she is unable to recognize her daughter, or she stops taking oral medication.  At that time, she will experience a rapid decline in her already deteriorating skills.  I think that her daughter is prepared for this.  She knows that it is imperative to have around-the-clock observation for her mother and I will attempt to facilitate this.  I spent 30 minutes of face-to-face time with the patient and her daughter, more than half of it in consultation.  Deetta Perla MD

## 2012-08-22 NOTE — Progress Notes (Signed)
This encounter was created in error - please disregard.

## 2012-08-22 NOTE — Patient Instructions (Signed)
I will write the letter today.  No change in meds for now.

## 2012-08-23 ENCOUNTER — Encounter: Payer: Self-pay | Admitting: Pediatrics

## 2012-10-09 ENCOUNTER — Non-Acute Institutional Stay (SKILLED_NURSING_FACILITY): Payer: Medicare Other | Admitting: Nurse Practitioner

## 2012-10-09 DIAGNOSIS — R000 Tachycardia, unspecified: Secondary | ICD-10-CM

## 2012-10-09 DIAGNOSIS — G309 Alzheimer's disease, unspecified: Secondary | ICD-10-CM

## 2012-10-09 DIAGNOSIS — I10 Essential (primary) hypertension: Secondary | ICD-10-CM

## 2012-10-09 DIAGNOSIS — K5900 Constipation, unspecified: Secondary | ICD-10-CM

## 2012-10-09 DIAGNOSIS — F329 Major depressive disorder, single episode, unspecified: Secondary | ICD-10-CM

## 2012-10-09 NOTE — Assessment & Plan Note (Signed)
Rate controlle don Metoprolol 25 mg bid.    

## 2012-10-09 NOTE — Assessment & Plan Note (Signed)
Senna S II bid. Stable.            

## 2012-10-09 NOTE — Progress Notes (Signed)
Patient ID: Shelby Rodriguez, female   DOB: 1929-07-03, 79 y.o.   MRN: [id number] Code Status: DNR  No Known Allergies  Chief Complaint  Patient presents with  . Medical Managment of Chronic Issues    HPI: Patient is a 79 y.o. female seen in the [facility] at [facility] today for evaluation of her chronic medical conditions.  Problem List Items Addressed This Visit   Alzheimer's disease - Primary     Exelon 9.5mg /24hr daily and Namenda 5mg  bid.wanders.takes Zofran for Exelon related nausea.         Depressive disorder, not elsewhere classified     Mirtazapine 15mg       Tachycardia, unspecified     Rate controlled on Metoprolol 25mg  bid      Unspecified constipation     Senna S II bid. Stable.       Unspecified essential hypertension     Controlled on Metoprolol 25mg  bid.            Review of Systems:  Review of Systems  Constitutional: Negative for fever, chills, weight loss, malaise/fatigue and diaphoresis.  HENT: Negative for hearing loss, congestion, sore throat and neck pain.   Eyes: Negative for blurred vision, double vision, photophobia, pain, discharge and redness.  Respiratory: Negative for cough, sputum production, shortness of breath and wheezing.   Cardiovascular: Positive for leg swelling. Negative for chest pain, palpitations, orthopnea, claudication and PND.  Gastrointestinal: Negative for heartburn, nausea (has been better since Zofran 4mg  with meals. ), vomiting, abdominal pain, diarrhea and constipation.  Genitourinary: Positive for frequency (incontinent of bladder. ). Negative for dysuria, urgency and flank pain.  Musculoskeletal: Negative for back pain and joint pain.  Skin: Negative for rash.  Neurological: Negative for dizziness, tremors, focal weakness, seizures, loss of consciousness, weakness and headaches.  Endo/Heme/Allergies: Negative for environmental allergies and polydipsia. Does not bruise/bleed easily.  Psychiatric/Behavioral:  Positive for memory loss. Negative for depression and hallucinations. The patient is not nervous/anxious and does not have insomnia.      Past Medical History  Diagnosis Date  . Unspecified disorder of kidney and ureter   . Abdominal pain, periumbilic   . Tachycardia, unspecified   . Sleep disturbance, unspecified   . Tachycardia, unspecified   . Reflux esophagitis   . Unspecified constipation   . Abnormality of gait   . Unspecified urinary incontinence   . Pathologic fracture of vertebrae   . Fall from other slipping, tripping, or stumbling   . Backache, unspecified   . Emphysematous bleb   . Alcohol-induced persisting amnestic disorder   . Malignant neoplasm of breast (female), unspecified site   . Depressive disorder, not elsewhere classified   . Diffuse cystic mastopathy   . Disorders of bursae and tendons in shoulder region, unspecified   . Other and unspecified hyperlipidemia   . Macular degeneration (senile) of retina, unspecified   . Unspecified essential hypertension   . Osteoporosis, unspecified   . Dizziness and giddiness   . Other malaise and fatigue   . Urinary frequency   . Pain in joint, lower leg   . Alzheimer's disease    Past Surgical History  Procedure Laterality Date  . Rotator cuff repair    . Mastectomy Bilateral 2001  . Tennis elbow repair    . Aspiration of recurrent breast cysts Bilateral [phone]   Social History:   reports that she has never smoked. She has never used smokeless tobacco. She reports that she does not drink alcohol  or use illicit drugs.  Family History  Problem Relation Age of Onset  . Alzheimer's disease Father     Died at 30  . Parkinsonism Mother     Died at 62    Medications: Reviewed at [facility]   Physical Exam: Physical Exam  Constitutional: She appears well-developed and well-nourished. No distress.  HENT:  Head: Normocephalic and atraumatic.  Eyes: EOM are normal. Pupils are equal, round, and reactive to light.  Right eye exhibits no discharge. Left eye exhibits no discharge.  Neck: Normal range of motion. Neck supple. No JVD present. No thyromegaly present.  Cardiovascular: Normal rate and regular rhythm.   No murmur heard. Pulmonary/Chest: Effort normal and breath sounds normal. She has no wheezes. She has no rales.  Abdominal: Soft. Bowel sounds are normal. She exhibits no distension. There is no tenderness.  Genitourinary: Rectal exam shows no external hemorrhoid, no internal hemorrhoid, no fissure, no mass and no tenderness. Pelvic exam was performed with patient supine. No labial fusion. There is no rash, tenderness, lesion or injury on the right labia. There is no rash, tenderness, lesion or injury on the left labia. No erythema, tenderness or bleeding around the vagina. No foreign body around the vagina. No signs of injury around the vagina. No vaginal discharge found.  Lymphadenopathy:    She has no cervical adenopathy.       Right: No inguinal adenopathy present.       Left: No inguinal adenopathy present.  Neurological: She is alert. She displays normal reflexes. No cranial nerve deficit. She exhibits normal muscle tone. Coordination normal.  Skin: Skin is warm and dry. She is not diaphoretic.  Psychiatric: Her mood appears not anxious. Her affect is not angry, not blunt and not labile. Her speech is delayed and tangential. She is not agitated, not aggressive, not hyperactive, not slowed, not withdrawn, not actively hallucinating and not combative. Thought content is not paranoid and not delusional. Cognition and memory are impaired. She expresses impulsivity and inappropriate judgment. She does not exhibit a depressed mood. She exhibits abnormal recent memory and abnormal remote memory. She is attentive.    Filed Vitals:   10/09/12 1218  BP: 116/74  Pulse: 76  Temp: 97.6 F (36.4 C)  TempSrc: Tympanic  Resp: 20      Labs reviewed: Basic Metabolic Panel:  Recent Labs  47/82/95  06/23/12  NA 140  --   K 4.1  --   BUN 17  --   CREATININE 1.0  --   TSH 2.02 1.00    CBC:  Recent Labs  06/16/12 06/23/12  WBC 7.1 5.7  HGB 12.4 11.2*  HCT 36 34*  PLT 191 265       Assessment/Plan Alzheimer's disease Exelon 9.5mg /24hr daily and Namenda 5mg  bid.wanders.takes Zofran for Exelon related nausea.       Tachycardia, unspecified Rate controlled on Metoprolol 25mg  bid    Unspecified essential hypertension Controlled on Metoprolol 25mg  bid.       Depressive disorder, not elsewhere classified Mirtazapine 15mg     Unspecified constipation Senna S II bid. Stable.       Family/ Staff Communication: observe the patient.   Goals of Care: SNF  Labs/tests ordered: none

## 2012-10-09 NOTE — Assessment & Plan Note (Signed)
Controlled on Metoprolol 25mg bid.    

## 2012-10-09 NOTE — Assessment & Plan Note (Signed)
Mirtazapine 15 mg

## 2012-10-09 NOTE — Assessment & Plan Note (Signed)
Exelon 9.5mg /24hr daily and Namenda 5mg  bid.wanders.takes Zofran for Exelon related nausea.

## 2012-10-30 ENCOUNTER — Encounter: Payer: Self-pay | Admitting: Internal Medicine

## 2012-10-31 ENCOUNTER — Non-Acute Institutional Stay (SKILLED_NURSING_FACILITY): Payer: Medicare Other | Admitting: Nurse Practitioner

## 2012-10-31 ENCOUNTER — Encounter: Payer: Self-pay | Admitting: Nurse Practitioner

## 2012-10-31 DIAGNOSIS — K219 Gastro-esophageal reflux disease without esophagitis: Secondary | ICD-10-CM

## 2012-10-31 DIAGNOSIS — M1710 Unilateral primary osteoarthritis, unspecified knee: Secondary | ICD-10-CM

## 2012-10-31 DIAGNOSIS — I10 Essential (primary) hypertension: Secondary | ICD-10-CM

## 2012-10-31 DIAGNOSIS — G309 Alzheimer's disease, unspecified: Secondary | ICD-10-CM

## 2012-10-31 DIAGNOSIS — F329 Major depressive disorder, single episode, unspecified: Secondary | ICD-10-CM

## 2012-10-31 DIAGNOSIS — K5900 Constipation, unspecified: Secondary | ICD-10-CM

## 2012-10-31 NOTE — Progress Notes (Signed)
Patient ID: Shelby Rodriguez, female   DOB: 12/17/1929, 79 y.o.   MRN: [id number] Code Status: DNR  No Known Allergies  Chief Complaint  Patient presents with  . Medical Managment of Chronic Issues    HPI: Patient is a 79 y.o. female seen in the SNF at [facility] today for evaluation of dementia and other chronic medical conditions.  Problem List Items Addressed This Visit   Alzheimer's disease - Primary     Exelon 9.5mg /24hr daily and Namenda 14mg . wanders.takes Zofran for Exelon related nausea.           Depressive disorder, not elsewhere classified     Mirtazapine 15mg -stable.         GERD (gastroesophageal reflux disease)     Stable Omeprazole 20mg  and Zofran 4mg  tid.         Osteoarthrosis, unspecified whether generalized or localized, involving lower leg     Takes Tylenol 500mg  bid.     Unspecified constipation     Senna S II bid. Stable.         Unspecified essential hypertension     Controlled on Metoprolol 25mg  bid.              Review of Systems:  Review of Systems  Constitutional: Negative for fever, chills, weight loss, malaise/fatigue and diaphoresis.  HENT: Negative for hearing loss, congestion, sore throat and neck pain.   Eyes: Negative for blurred vision, double vision, photophobia, pain, discharge and redness.  Respiratory: Negative for cough, sputum production, shortness of breath and wheezing.   Cardiovascular: Positive for leg swelling. Negative for chest pain, palpitations, orthopnea, claudication and PND.  Gastrointestinal: Negative for heartburn, nausea (has been better since Zofran 4mg  with meals. ), vomiting, abdominal pain, diarrhea and constipation.  Genitourinary: Positive for frequency (incontinent of bladder. ). Negative for dysuria, urgency and flank pain.  Musculoskeletal: Negative for back pain and joint pain.  Skin: Negative for rash.  Neurological: Negative for dizziness, tremors, focal weakness, seizures, loss  of consciousness, weakness and headaches.  Endo/Heme/Allergies: Negative for environmental allergies and polydipsia. Does not bruise/bleed easily.  Psychiatric/Behavioral: Positive for memory loss. Negative for depression and hallucinations. The patient is not nervous/anxious and does not have insomnia.      Past Medical History  Diagnosis Date  . Unspecified disorder of kidney and ureter   . Abdominal pain, periumbilic   . Tachycardia, unspecified   . Sleep disturbance, unspecified   . Tachycardia, unspecified   . Reflux esophagitis   . Unspecified constipation   . Abnormality of gait   . Unspecified urinary incontinence   . Pathologic fracture of vertebrae   . Fall from other slipping, tripping, or stumbling   . Backache, unspecified   . Emphysematous bleb   . Alcohol-induced persisting amnestic disorder   . Malignant neoplasm of breast (female), unspecified site   . Depressive disorder, not elsewhere classified   . Diffuse cystic mastopathy   . Disorders of bursae and tendons in shoulder region, unspecified   . Other and unspecified hyperlipidemia   . Macular degeneration (senile) of retina, unspecified   . Unspecified essential hypertension   . Osteoporosis, unspecified   . Dizziness and giddiness   . Other malaise and fatigue   . Urinary frequency   . Pain in joint, lower leg   . Alzheimer's disease    Past Surgical History  Procedure Laterality Date  . Rotator cuff repair    . Mastectomy Bilateral 2001  . Tennis elbow  repair    . Aspiration of recurrent breast cysts Bilateral [phone]   Social History:   reports that she has never smoked. She has never used smokeless tobacco. She reports that she does not drink alcohol or use illicit drugs.  Family History  Problem Relation Age of Onset  . Alzheimer's disease Father     Died at 85  . Parkinsonism Mother     Died at 47    Medications: Reviewed at [facility]   Physical Exam: Physical Exam  Constitutional: She  appears well-developed and well-nourished. No distress.  HENT:  Head: Normocephalic and atraumatic.  Eyes: EOM are normal. Pupils are equal, round, and reactive to light. Right eye exhibits no discharge. Left eye exhibits no discharge.  Neck: Normal range of motion. Neck supple. No JVD present. No thyromegaly present.  Cardiovascular: Normal rate and regular rhythm.   No murmur heard. Pulmonary/Chest: Effort normal and breath sounds normal. She has no wheezes. She has no rales.  Abdominal: Soft. Bowel sounds are normal. She exhibits no distension. There is no tenderness.  Genitourinary: Rectal exam shows no external hemorrhoid, no internal hemorrhoid, no fissure, no mass and no tenderness. Pelvic exam was performed with patient supine. No labial fusion. There is no rash, tenderness, lesion or injury on the right labia. There is no rash, tenderness, lesion or injury on the left labia. No erythema, tenderness or bleeding around the vagina. No foreign body around the vagina. No signs of injury around the vagina. No vaginal discharge found.  Lymphadenopathy:    She has no cervical adenopathy.       Right: No inguinal adenopathy present.       Left: No inguinal adenopathy present.  Neurological: She is alert. She displays normal reflexes. No cranial nerve deficit. She exhibits normal muscle tone. Coordination normal.  Skin: Skin is warm and dry. She is not diaphoretic.  Psychiatric: Her mood appears not anxious. Her affect is not angry, not blunt and not labile. Her speech is delayed and tangential. She is not agitated, not aggressive, not hyperactive, not slowed, not withdrawn, not actively hallucinating and not combative. Thought content is not paranoid and not delusional. Cognition and memory are impaired. She expresses impulsivity and inappropriate judgment. She does not exhibit a depressed mood. She exhibits abnormal recent memory and abnormal remote memory. She is attentive.    Filed Vitals:    10/31/12 1623  BP: 134/77  Pulse: 78  Temp: 98.8 F (37.1 C)  TempSrc: Tympanic  Resp: 18      Labs reviewed: Basic Metabolic Panel:  Recent Labs  54/09/81 06/23/12  NA 140  --   K 4.1  --   BUN 17  --   CREATININE 1.0  --   TSH 2.02 1.00   CBC:  Recent Labs  06/16/12 06/23/12  WBC 7.1 5.7  HGB 12.4 11.2*  HCT 36 34*  PLT 191 265   Past Procedures: 07/06/11 CT abd/pelvis with CM:  IMPRESSION:  1. No acute findings identified within the abdomen or pelvis.  2. Small pulmonary nodules in both lung bases are nonspecific.  The largest measures 5.1 mm. If the patient is at high risk for  bronchogenic carcinoma, follow-up chest CT at 6-12 months is  recommended. If the patient is at low risk for bronchogenic  carcinoma, follow-up chest CT at 12 months is recommended. This  recommendation follows the consensus statement: Guidelines for  Management of Small Pulmonary Nodules Detected on CT Scans: A  Statement from  the Fleischner Society as published in Radiology  2005; 237:395-400.  3. T11 compression deformity as demonstrated on MRI from  11/03/2008. This is stable from previous study.    Assessment/Plan Alzheimer's disease Exelon 9.5mg /24hr daily and Namenda 14mg . wanders.takes Zofran for Exelon related nausea.         GERD (gastroesophageal reflux disease) Stable Omeprazole 20mg  and Zofran 4mg  tid.       Osteoarthrosis, unspecified whether generalized or localized, involving lower leg Takes Tylenol 500mg  bid.   Unspecified essential hypertension Controlled on Metoprolol 25mg  bid.         Unspecified constipation Senna S II bid. Stable.       Depressive disorder, not elsewhere classified Mirtazapine 15mg -stable.         Family/ Staff Communication: observe the patient.   Goals of Care: SNF  Labs/tests ordered: none

## 2012-10-31 NOTE — Assessment & Plan Note (Signed)
Controlled on Metoprolol 25mg bid.    

## 2012-10-31 NOTE — Assessment & Plan Note (Signed)
Exelon 9.5mg/24hr daily and Namenda 14mg. wanders.takes Zofran for Exelon related nausea.            

## 2012-10-31 NOTE — Assessment & Plan Note (Signed)
Stable Omeprazole 20mg and Zofran 4mg tid.        

## 2012-10-31 NOTE — Assessment & Plan Note (Signed)
Senna S II bid. Stable.

## 2012-10-31 NOTE — Assessment & Plan Note (Signed)
Mirtazapine 15mg -stable.

## 2012-10-31 NOTE — Assessment & Plan Note (Signed)
Takes Tylenol 500mg  bid.

## 2012-11-18 ENCOUNTER — Encounter: Payer: Self-pay | Admitting: Nurse Practitioner

## 2012-11-18 ENCOUNTER — Non-Acute Institutional Stay (SKILLED_NURSING_FACILITY): Payer: Medicare Other | Admitting: Nurse Practitioner

## 2012-11-18 DIAGNOSIS — F0280 Dementia in other diseases classified elsewhere without behavioral disturbance: Secondary | ICD-10-CM

## 2012-11-18 DIAGNOSIS — F329 Major depressive disorder, single episode, unspecified: Secondary | ICD-10-CM

## 2012-11-18 DIAGNOSIS — N390 Urinary tract infection, site not specified: Secondary | ICD-10-CM

## 2012-11-18 DIAGNOSIS — K5900 Constipation, unspecified: Secondary | ICD-10-CM

## 2012-11-18 DIAGNOSIS — K219 Gastro-esophageal reflux disease without esophagitis: Secondary | ICD-10-CM

## 2012-11-18 DIAGNOSIS — R000 Tachycardia, unspecified: Secondary | ICD-10-CM

## 2012-11-18 NOTE — Assessment & Plan Note (Signed)
Rate controlle don Metoprolol 25 mg bid.    

## 2012-11-18 NOTE — Assessment & Plan Note (Signed)
Unable to obtain Cath UA C/S due to her agitation. The patient is more confused, agitated, and restlessness-will treat her with Ampicillin 500mg  q6hr for 7 days for clinically presumed UTI--observe the patient's behaviors.

## 2012-11-18 NOTE — Assessment & Plan Note (Signed)
Stable Omeprazole 20mg  and Zofran 4mg  tid.

## 2012-11-18 NOTE — Assessment & Plan Note (Signed)
Mirtazapine 15 mg

## 2012-11-18 NOTE — Assessment & Plan Note (Signed)
Exelon 9.5mg /24hr daily and Namenda 14mg . wanders.takes Zofran for Exelon related nausea.

## 2012-11-18 NOTE — Assessment & Plan Note (Signed)
Senna S II bid. Stable.            

## 2012-11-18 NOTE — Progress Notes (Signed)
Patient ID: Shelby Rodriguez, female   DOB: 1929-05-22, 79 y.o.   MRN: [id number] Code Status: DNR  No Known Allergies  Chief Complaint  Patient presents with  . Medical Managment of Chronic Issues    restless/agitaiton.     HPI: Patient is a 79 y.o. female seen in the SNF at [facility] today for evaluation of agitation/restlessness and other chronic medical conditions.  Problem List Items Addressed This Visit   Alzheimer's disease     Exelon 9.5mg /24hr daily and Namenda 14mg . wanders.takes Zofran for Exelon related nausea.             Depressive disorder, not elsewhere classified     Mirtazapine 15mg         GERD (gastroesophageal reflux disease)     Stable Omeprazole 20mg  and Zofran 4mg  tid.           Tachycardia, unspecified     Rate controlled on Metoprolol 25mg  bid        Unspecified constipation     Senna S II bid. Stable.           Urinary tract infection, site not specified - Primary     Unable to obtain Cath UA C/S due to her agitation. The patient is more confused, agitated, and restlessness-will treat her with Ampicillin 500mg  q6hr for 7 days for clinically presumed UTI--observe the patient's behaviors.        Review of Systems:  Review of Systems  Constitutional: Negative for fever, chills, weight loss, malaise/fatigue and diaphoresis.  HENT: Negative for hearing loss, congestion, sore throat and neck pain.   Eyes: Negative for blurred vision, double vision, photophobia, pain, discharge and redness.  Respiratory: Negative for cough, sputum production, shortness of breath and wheezing.   Cardiovascular: Positive for leg swelling. Negative for chest pain, palpitations, orthopnea, claudication and PND.  Gastrointestinal: Negative for heartburn, nausea (has been better since Zofran 4mg  with meals. ), vomiting, abdominal pain, diarrhea and constipation.  Genitourinary: Positive for frequency (incontinent of bladder. ). Negative for  dysuria, urgency and flank pain.  Musculoskeletal: Negative for back pain and joint pain.  Skin: Negative for rash.  Neurological: Negative for dizziness, tremors, focal weakness, seizures, loss of consciousness, weakness and headaches.  Endo/Heme/Allergies: Negative for environmental allergies and polydipsia. Does not bruise/bleed easily.  Psychiatric/Behavioral: Positive for memory loss. Negative for depression and hallucinations. The patient is nervous/anxious. The patient does not have insomnia.        Restlessness and agitation.      Past Medical History  Diagnosis Date  . Unspecified disorder of kidney and ureter   . Abdominal pain, periumbilic   . Tachycardia, unspecified   . Sleep disturbance, unspecified   . Tachycardia, unspecified   . Reflux esophagitis   . Unspecified constipation   . Abnormality of gait   . Unspecified urinary incontinence   . Pathologic fracture of vertebrae   . Fall from other slipping, tripping, or stumbling   . Backache, unspecified   . Emphysematous bleb   . Alcohol-induced persisting amnestic disorder   . Malignant neoplasm of breast (female), unspecified site   . Depressive disorder, not elsewhere classified   . Diffuse cystic mastopathy   . Disorders of bursae and tendons in shoulder region, unspecified   . Other and unspecified hyperlipidemia   . Macular degeneration (senile) of retina, unspecified   . Unspecified essential hypertension   . Osteoporosis, unspecified   . Dizziness and giddiness   . Other malaise and fatigue   .  Urinary frequency   . Pain in joint, lower leg   . Alzheimer's disease    Past Surgical History  Procedure Laterality Date  . Rotator cuff repair    . Mastectomy Bilateral 2001  . Tennis elbow repair    . Aspiration of recurrent breast cysts Bilateral [phone]   Social History:   reports that she has never smoked. She has never used smokeless tobacco. She reports that she does not drink alcohol or use  illicit drugs.  Family History  Problem Relation Age of Onset  . Alzheimer's disease Father     Died at 70  . Parkinsonism Mother     Died at 42    Medications: Reviewed at [facility]   Physical Exam: Physical Exam  Constitutional: She appears well-developed and well-nourished. No distress.  HENT:  Head: Normocephalic and atraumatic.  Eyes: EOM are normal. Pupils are equal, round, and reactive to light. Right eye exhibits no discharge. Left eye exhibits no discharge.  Neck: Normal range of motion. Neck supple. No JVD present. No thyromegaly present.  Cardiovascular: Normal rate and regular rhythm.   No murmur heard. Pulmonary/Chest: Effort normal and breath sounds normal. She has no wheezes. She has no rales.  Abdominal: Soft. Bowel sounds are normal. She exhibits no distension. There is no tenderness.  Genitourinary: Rectal exam shows no external hemorrhoid, no internal hemorrhoid, no fissure, no mass and no tenderness. Pelvic exam was performed with patient supine. No labial fusion. There is no rash, tenderness, lesion or injury on the right labia. There is no rash, tenderness, lesion or injury on the left labia. No erythema, tenderness or bleeding around the vagina. No foreign body around the vagina. No signs of injury around the vagina. No vaginal discharge found.  Lymphadenopathy:    She has no cervical adenopathy.       Right: No inguinal adenopathy present.       Left: No inguinal adenopathy present.  Neurological: She is alert. She displays normal reflexes. No cranial nerve deficit. She exhibits normal muscle tone. Coordination normal.  Skin: Skin is warm and dry. She is not diaphoretic.  Psychiatric: Her mood appears not anxious. Her affect is not angry, not blunt and not labile. Her speech is delayed and tangential. She is not agitated, not aggressive, not hyperactive, not slowed, not withdrawn, not actively hallucinating and not combative. Thought content is not paranoid and not  delusional. Cognition and memory are impaired. She expresses impulsivity and inappropriate judgment. She does not exhibit a depressed mood. She exhibits abnormal recent memory and abnormal remote memory. She is attentive.    Filed Vitals:   11/18/12 1438  BP: 110/84  Pulse: 80  Temp: 97.5 F (36.4 C)  TempSrc: Tympanic  Resp: 20      Labs reviewed: Basic Metabolic Panel:  Recent Labs  54/09/81 06/23/12  NA 140  --   K 4.1  --   BUN 17  --   CREATININE 1.0  --   TSH 2.02 1.00   CBC:  Recent Labs  06/16/12 06/23/12  WBC 7.1 5.7  HGB 12.4 11.2*  HCT 36 34*  PLT 191 265   Past Procedures: 07/06/11 CT abd/pelvis with CM:  IMPRESSION:  1. No acute findings identified within the abdomen or pelvis.  2. Small pulmonary nodules in both lung bases are nonspecific.  The largest measures 5.1 mm. If the patient is at high risk for  bronchogenic carcinoma, follow-up chest CT at 6-12 months is  recommended. If  the patient is at low risk for bronchogenic  carcinoma, follow-up chest CT at 12 months is recommended. This  recommendation follows the consensus statement: Guidelines for  Management of Small Pulmonary Nodules Detected on CT Scans: A  Statement from the Fleischner Society as published in Radiology  2005; 237:395-400.  3. T11 compression deformity as demonstrated on MRI from  11/03/2008. This is stable from previous study.    Assessment/Plan Urinary tract infection, site not specified Unable to obtain Cath UA C/S due to her agitation. The patient is more confused, agitated, and restlessness-will treat her with Ampicillin 500mg  q6hr for 7 days for clinically presumed UTI--observe the patient's behaviors.   Tachycardia, unspecified Rate controlled on Metoprolol 25mg  bid      Alzheimer's disease Exelon 9.5mg /24hr daily and Namenda 14mg . wanders.takes Zofran for Exelon related nausea.           GERD (gastroesophageal reflux disease) Stable Omeprazole 20mg   and Zofran 4mg  tid.         Unspecified constipation Senna S II bid. Stable.         Depressive disorder, not elsewhere classified Mirtazapine 15mg         Family/ Staff Communication: observe the patient.   Goals of Care: SNF  Labs/tests ordered: none

## 2012-12-15 ENCOUNTER — Telehealth: Payer: Self-pay

## 2012-12-15 NOTE — Telephone Encounter (Signed)
I called Shelby Rodriguez and she said that she was at work. She said that she is going to have the nursing home fax over a list of concerns bc there are too many to try to explain over the phone. I gave her our fax number. She said that this will probably  be done tomorrow 12/16/12.

## 2012-12-15 NOTE — Telephone Encounter (Signed)
Shelby Rodriguez, daughter, lvm stating that she knows Dr. Rexene Edison and Marcelino Duster are out of the office today. Shelby Rodriguez said that it is all right to call her tomorrow. She said that she called to make an appt with Dr.Hickling, and cannot get in until November. Shelby Rodriguez said that the nursing home has some concerns and would like her seen sooner. They said that she is having new but consistent difficulties. I tried calling Shelby Rodriguez back for more details and was unable to leave a vm on her cell. The number she left for work is [phone], however, this goes to a man's vm.

## 2012-12-16 NOTE — Telephone Encounter (Signed)
I spoke with Shelby Rodriguez the patient's daughter she's very concerned about her mom and the nursing facility is as well, she states that her mother is very combative and her body appears to be drawing up is best way she says she can describe what appears to be happening, Dr. Sharene Skeans had an opening on Oct. 17 at 10:45 am and I have placed [name] in that time slot with an arrival time of 10:30 am, Shelby Rodriguez accepted and conformed appointment. MB

## 2012-12-16 NOTE — Telephone Encounter (Signed)
Noted, there were earlier openings, but daughter was unable to come to those appointments.  She is the sole source of transportation and the guardian for this patient.

## 2012-12-23 ENCOUNTER — Non-Acute Institutional Stay (SKILLED_NURSING_FACILITY): Payer: Medicare Other | Admitting: Nurse Practitioner

## 2012-12-23 ENCOUNTER — Encounter: Payer: Self-pay | Admitting: Nurse Practitioner

## 2012-12-23 DIAGNOSIS — F0280 Dementia in other diseases classified elsewhere without behavioral disturbance: Secondary | ICD-10-CM

## 2012-12-23 DIAGNOSIS — R609 Edema, unspecified: Secondary | ICD-10-CM

## 2012-12-23 DIAGNOSIS — R000 Tachycardia, unspecified: Secondary | ICD-10-CM

## 2012-12-23 DIAGNOSIS — D649 Anemia, unspecified: Secondary | ICD-10-CM

## 2012-12-23 DIAGNOSIS — I10 Essential (primary) hypertension: Secondary | ICD-10-CM

## 2012-12-23 DIAGNOSIS — M1710 Unilateral primary osteoarthritis, unspecified knee: Secondary | ICD-10-CM

## 2012-12-23 DIAGNOSIS — K5900 Constipation, unspecified: Secondary | ICD-10-CM

## 2012-12-23 DIAGNOSIS — K219 Gastro-esophageal reflux disease without esophagitis: Secondary | ICD-10-CM

## 2012-12-23 DIAGNOSIS — F329 Major depressive disorder, single episode, unspecified: Secondary | ICD-10-CM

## 2012-12-23 DIAGNOSIS — R269 Unspecified abnormalities of gait and mobility: Secondary | ICD-10-CM

## 2012-12-23 DIAGNOSIS — R634 Abnormal weight loss: Secondary | ICD-10-CM

## 2012-12-23 NOTE — Assessment & Plan Note (Signed)
Senna S II bid. Stable.            

## 2012-12-23 NOTE — Assessment & Plan Note (Signed)
Not apparent.  

## 2012-12-23 NOTE — Assessment & Plan Note (Addendum)
Last Hgb 11.2 06/23/12. Update CBC

## 2012-12-23 NOTE — Assessment & Plan Note (Addendum)
Controlled on Metoprolol 25mg  bid. Update TSH and CMP

## 2012-12-23 NOTE — Assessment & Plan Note (Signed)
Rate controlle don Metoprolol 25 mg bid.    

## 2012-12-23 NOTE — Assessment & Plan Note (Signed)
Stable Omeprazole 20mg and Zofran 4mg tid.        

## 2012-12-23 NOTE — Assessment & Plan Note (Signed)
Takes Tylenol 500mg  bid.

## 2012-12-23 NOTE — Assessment & Plan Note (Signed)
Exelon 9.5mg /24hr daily and Namenda 14mg . wanders.takes Zofran for Exelon related nausea.

## 2012-12-23 NOTE — Progress Notes (Signed)
Patient ID: Shelby Rodriguez, female   DOB: 1929-07-01, 79 y.o.   MRN: [id number]  Code Status: DNR  No Known Allergies  Chief Complaint  Patient presents with  . Medical Managment of Chronic Issues    HPI: Patient is a 79 y.o. female seen in the SNF at [facility] today for evaluation of her chronic medical conditions.      Problem List Items Addressed This Visit   Abnormality of gait - Primary     Gradually w/c dependent related to neurological progression of dementia and her generalized physical frailty.     Alzheimer's disease     Exelon 9.5mg /24hr daily and Namenda 14mg . wanders.takes Zofran for Exelon related nausea.               Anemia, unspecified     Last Hgb 11.2 06/23/12. Update CBC    Depressive disorder, not elsewhere classified     Mirtazapine 15mg --stable           Edema     Not apparent.     GERD (gastroesophageal reflux disease)     Stable Omeprazole 20mg  and Zofran 4mg  tid.             Relevant Medications      senna-docusate (SENOKOT-S) 8.6-50 MG per tablet   Loss of weight     Resolved. She has gain # 13 Ibs in the past year.     Osteoarthrosis, unspecified whether generalized or localized, involving lower leg     Takes Tylenol 500mg  bid.       Relevant Medications      acetaminophen (TYLENOL) 500 MG tablet   Tachycardia, unspecified     Rate controlled on Metoprolol 25mg  bid          Unspecified constipation     Senna S II bid. Stable.             Unspecified essential hypertension     Controlled on Metoprolol 25mg  bid. Update TSH and CMP               Review of Systems:  Review of Systems  Constitutional: Negative for fever, chills, weight loss, malaise/fatigue and diaphoresis.  HENT: Negative for hearing loss, congestion, sore throat and neck pain.   Eyes: Negative for blurred vision, double vision, photophobia, pain, discharge and redness.  Respiratory: Negative for cough, sputum  production, shortness of breath and wheezing.   Cardiovascular: Positive for leg swelling. Negative for chest pain, palpitations, orthopnea, claudication and PND.  Gastrointestinal: Negative for heartburn, nausea (has been better since Zofran 4mg  with meals. ), vomiting, abdominal pain, diarrhea and constipation.  Genitourinary: Positive for frequency (incontinent of bladder. ). Negative for dysuria, urgency and flank pain.  Musculoskeletal: Negative for back pain and joint pain.  Skin: Negative for rash.  Neurological: Negative for dizziness, tremors, focal weakness, seizures, loss of consciousness, weakness and headaches.  Endo/Heme/Allergies: Negative for environmental allergies and polydipsia. Does not bruise/bleed easily.  Psychiatric/Behavioral: Positive for memory loss. Negative for depression and hallucinations. The patient is nervous/anxious. The patient does not have insomnia.        Restlessness and agitation.      Past Medical History  Diagnosis Date  . Unspecified disorder of kidney and ureter   . Abdominal pain, periumbilic   . Tachycardia, unspecified   . Sleep disturbance, unspecified   . Tachycardia, unspecified   . Reflux esophagitis   . Unspecified constipation   . Abnormality of gait   .  Unspecified urinary incontinence   . Pathologic fracture of vertebrae   . Fall from other slipping, tripping, or stumbling   . Backache, unspecified   . Emphysematous bleb   . Alcohol-induced persisting amnestic disorder   . Malignant neoplasm of breast (female), unspecified site   . Depressive disorder, not elsewhere classified   . Diffuse cystic mastopathy   . Disorders of bursae and tendons in shoulder region, unspecified   . Other and unspecified hyperlipidemia   . Macular degeneration (senile) of retina, unspecified   . Unspecified essential hypertension   . Osteoporosis, unspecified   . Dizziness and giddiness   . Other malaise and fatigue   . Urinary frequency   .  Pain in joint, lower leg   . Alzheimer's disease    Past Surgical History  Procedure Laterality Date  . Rotator cuff repair    . Mastectomy Bilateral 2001  . Tennis elbow repair    . Aspiration of recurrent breast cysts Bilateral [phone]   Social History:   reports that she has never smoked. She has never used smokeless tobacco. She reports that she does not drink alcohol or use illicit drugs.  Family History  Problem Relation Age of Onset  . Alzheimer's disease Father     Died at 74  . Parkinsonism Mother     Died at 12    Medications:   Medication List       This list is accurate as of: 12/23/12  2:27 PM.  Always use your most recent med list.               acetaminophen 500 MG tablet  Commonly known as:  TYLENOL  Take 500 mg by mouth 2 (two) times daily.     atorvastatin 20 MG tablet  Commonly known as:  LIPITOR     Calcium-Cholecalciferol 200-250 MG-UNIT Tabs  Take by mouth daily.     EXELON 9.5 mg/24hr  Generic drug:  rivastigmine     metoprolol tartrate 25 MG tablet  Commonly known as:  LOPRESSOR     mirtazapine 15 MG tablet  Commonly known as:  REMERON     NAMENDA XR 14 MG Cp24  Generic drug:  Memantine HCl ER     omeprazole 20 MG capsule  Commonly known as:  PRILOSEC     ondansetron 4 MG tablet  Commonly known as:  ZOFRAN     senna-docusate 8.6-50 MG per tablet  Commonly known as:  Senokot-S  Take 2 tablets by mouth 2 (two) times daily.         Physical Exam: Physical Exam  Constitutional: She appears well-developed and well-nourished. No distress.  HENT:  Head: Normocephalic and atraumatic.  Eyes: EOM are normal. Pupils are equal, round, and reactive to light. Right eye exhibits no discharge. Left eye exhibits no discharge.  Neck: Normal range of motion. Neck supple. No JVD present. No thyromegaly present.  Cardiovascular: Normal rate and regular rhythm.   No murmur heard. Pulmonary/Chest: Effort normal and breath sounds normal.  She has no wheezes. She has no rales.  Abdominal: Soft. Bowel sounds are normal. She exhibits no distension. There is no tenderness.  Genitourinary: Rectal exam shows no external hemorrhoid, no internal hemorrhoid, no fissure, no mass and no tenderness. Pelvic exam was performed with patient supine. No labial fusion. There is no rash, tenderness, lesion or injury on the right labia. There is no rash, tenderness, lesion or injury on the left labia. No erythema, tenderness or bleeding  around the vagina. No foreign body around the vagina. No signs of injury around the vagina. No vaginal discharge found.  Lymphadenopathy:    She has no cervical adenopathy.       Right: No inguinal adenopathy present.       Left: No inguinal adenopathy present.  Neurological: She is alert. She displays normal reflexes. No cranial nerve deficit. She exhibits normal muscle tone. Coordination normal.  Skin: Skin is warm and dry. She is not diaphoretic.  Psychiatric: Her mood appears not anxious. Her affect is not angry, not blunt and not labile. Her speech is delayed and tangential. She is not agitated, not aggressive, not hyperactive, not slowed, not withdrawn, not actively hallucinating and not combative. Thought content is not paranoid and not delusional. Cognition and memory are impaired. She expresses impulsivity and inappropriate judgment. She does not exhibit a depressed mood. She exhibits abnormal recent memory and abnormal remote memory. She is attentive.    Filed Vitals:   12/23/12 1412  BP: 124/80  Pulse: 82  Temp: 97.4 F (36.3 C)  TempSrc: Tympanic  Resp: 20      Labs reviewed: Basic Metabolic Panel:  Recent Labs  16/10/96 06/23/12  NA 140  --   K 4.1  --   BUN 17  --   CREATININE 1.0  --   TSH 2.02 1.00   CBC:  Recent Labs  06/16/12 06/23/12  WBC 7.1 5.7  HGB 12.4 11.2*  HCT 36 34*  PLT 191 265   Past Procedures: 07/06/11 CT abd/pelvis with CM:  IMPRESSION:  1. No acute findings  identified within the abdomen or pelvis.  2. Small pulmonary nodules in both lung bases are nonspecific.  The largest measures 5.1 mm. If the patient is at high risk for  bronchogenic carcinoma, follow-up chest CT at 6-12 months is  recommended. If the patient is at low risk for bronchogenic  carcinoma, follow-up chest CT at 12 months is recommended. This  recommendation follows the consensus statement: Guidelines for  Management of Small Pulmonary Nodules Detected on CT Scans: A  Statement from the Fleischner Society as published in Radiology  2005; 237:395-400.  3. T11 compression deformity as demonstrated on MRI from  11/03/2008. This is stable from previous study.    Assessment/Plan Abnormality of gait Gradually w/c dependent related to neurological progression of dementia and her generalized physical frailty.   Alzheimer's disease Exelon 9.5mg /24hr daily and Namenda 14mg . wanders.takes Zofran for Exelon related nausea.             Anemia, unspecified Last Hgb 11.2 06/23/12. Update CBC  Depressive disorder, not elsewhere classified Mirtazapine 15mg --stable         Edema Not apparent.   GERD (gastroesophageal reflux disease) Stable Omeprazole 20mg  and Zofran 4mg  tid.           Loss of weight Resolved. She has gain # 13 Ibs in the past year.   Osteoarthrosis, unspecified whether generalized or localized, involving lower leg Takes Tylenol 500mg  bid.     Tachycardia, unspecified Rate controlled on Metoprolol 25mg  bid        Unspecified constipation Senna S II bid. Stable.           Unspecified essential hypertension Controlled on Metoprolol 25mg  bid. Update TSH and CMP            Family/ Staff Communication: observe the patient.   Goals of Care: SNF  Labs/tests ordered: CBC, CMP, TSH

## 2012-12-23 NOTE — Assessment & Plan Note (Signed)
Gradually w/c dependent related to neurological progression of dementia and her generalized physical frailty.

## 2012-12-23 NOTE — Assessment & Plan Note (Signed)
Resolved. She has gain # 13 Ibs in the past year.

## 2012-12-23 NOTE — Assessment & Plan Note (Signed)
Mirtazapine 15mg --stable

## 2012-12-30 ENCOUNTER — Non-Acute Institutional Stay (SKILLED_NURSING_FACILITY): Payer: Medicare Other | Admitting: Nurse Practitioner

## 2012-12-30 ENCOUNTER — Encounter: Payer: Self-pay | Admitting: Nurse Practitioner

## 2012-12-30 DIAGNOSIS — K219 Gastro-esophageal reflux disease without esophagitis: Secondary | ICD-10-CM

## 2012-12-30 DIAGNOSIS — F329 Major depressive disorder, single episode, unspecified: Secondary | ICD-10-CM

## 2012-12-30 DIAGNOSIS — F0280 Dementia in other diseases classified elsewhere without behavioral disturbance: Secondary | ICD-10-CM

## 2012-12-30 DIAGNOSIS — I10 Essential (primary) hypertension: Secondary | ICD-10-CM

## 2012-12-30 DIAGNOSIS — R000 Tachycardia, unspecified: Secondary | ICD-10-CM

## 2012-12-30 DIAGNOSIS — K5900 Constipation, unspecified: Secondary | ICD-10-CM

## 2012-12-30 NOTE — Assessment & Plan Note (Signed)
Senna S II bid. Stable.

## 2012-12-30 NOTE — Assessment & Plan Note (Signed)
Stable Omeprazole 20mg  and Zofran 4mg  tid.

## 2012-12-30 NOTE — Assessment & Plan Note (Signed)
Controlled on Metoprolol 25mg  bid. 12/2012 128/82, 11/2012 124/80--will have Bp and P log-re-evaluate.

## 2012-12-30 NOTE — Assessment & Plan Note (Signed)
Mirtazapine 15mg--stable        

## 2012-12-30 NOTE — Assessment & Plan Note (Signed)
Rate controlle don Metoprolol 25 mg bid.    

## 2012-12-30 NOTE — Assessment & Plan Note (Signed)
Exelon 9.5mg /24hr daily and Namenda. Wanders, emotional outburst/combative with personal assistance due to lack of understanding of the surroundings. Ttakes Zofran for Exelon related nausea.

## 2012-12-30 NOTE — Progress Notes (Signed)
Patient ID: Shelby Rodriguez, female   DOB: Jul 22, 1929, 79 y.o.   MRN: [id number]  Code Status: DNR  No Known Allergies  Chief Complaint  Patient presents with  . Medical Managment of Chronic Issues    blood pressure    HPI: Patient is a 79 y.o. female seen in the SNF at [facility] today for evaluation of her blood pressure and other chronic medical conditions.      Problem List Items Addressed This Visit   Alzheimer's disease     Exelon 9.5mg /24hr daily and Namenda. Wanders, emotional outburst/combative with personal assistance due to lack of understanding of the surroundings. Ttakes Zofran for Exelon related nausea.                 Depressive disorder, not elsewhere classified     Mirtazapine 15mg --stable             GERD (gastroesophageal reflux disease)     Stable Omeprazole 20mg  and Zofran 4mg  tid.               Tachycardia, unspecified     Rate controlled on Metoprolol 25mg  bid            Unspecified constipation     Senna S II bid. Stable.               Unspecified essential hypertension - Primary     Controlled on Metoprolol 25mg  bid. 12/2012 128/82, 11/2012 124/80--will have Bp and P log-re-evaluate.                  Review of Systems:  Review of Systems  Constitutional: Negative for fever, chills, weight loss, malaise/fatigue and diaphoresis.  HENT: Negative for congestion, hearing loss and sore throat.   Eyes: Negative for blurred vision, double vision, photophobia, pain, discharge and redness.  Respiratory: Negative for cough, sputum production, shortness of breath and wheezing.   Cardiovascular: Positive for leg swelling. Negative for chest pain, palpitations, orthopnea, claudication and PND.  Gastrointestinal: Negative for heartburn, nausea (has been better since Zofran 4mg  with meals. ), vomiting, abdominal pain, diarrhea and constipation.  Genitourinary: Positive for frequency (incontinent of  bladder. ). Negative for dysuria, urgency and flank pain.  Musculoskeletal: Negative for back pain, joint pain and neck pain.  Skin: Negative for rash.  Neurological: Negative for dizziness, tremors, focal weakness, seizures, loss of consciousness, weakness and headaches.  Endo/Heme/Allergies: Negative for environmental allergies and polydipsia. Does not bruise/bleed easily.  Psychiatric/Behavioral: Positive for memory loss. Negative for depression and hallucinations. The patient is nervous/anxious. The patient does not have insomnia.        Restlessness and agitation.      Past Medical History  Diagnosis Date  . Unspecified disorder of kidney and ureter   . Abdominal pain, periumbilic   . Tachycardia, unspecified   . Sleep disturbance, unspecified   . Tachycardia, unspecified   . Reflux esophagitis   . Unspecified constipation   . Abnormality of gait   . Unspecified urinary incontinence   . Pathologic fracture of vertebrae   . Fall from other slipping, tripping, or stumbling   . Backache, unspecified   . Emphysematous bleb   . Alcohol-induced persisting amnestic disorder   . Malignant neoplasm of breast (female), unspecified site   . Depressive disorder, not elsewhere classified   . Diffuse cystic mastopathy   . Disorders of bursae and tendons in shoulder region, unspecified   . Other and unspecified hyperlipidemia   . Macular degeneration (  senile) of retina, unspecified   . Unspecified essential hypertension   . Osteoporosis, unspecified   . Dizziness and giddiness   . Other malaise and fatigue   . Urinary frequency   . Pain in joint, lower leg   . Alzheimer's disease    Past Surgical History  Procedure Laterality Date  . Rotator cuff repair    . Mastectomy Bilateral 2001  . Tennis elbow repair    . Aspiration of recurrent breast cysts Bilateral [phone]   Social History:   reports that she has never smoked. She has never used smokeless tobacco. She reports that she  does not drink alcohol or use illicit drugs.  Family History  Problem Relation Age of Onset  . Alzheimer's disease Father     Died at 30  . Parkinsonism Mother     Died at 66    Medications:   Medication List       This list is accurate as of: 12/30/12  4:53 PM.  Always use your most recent med list.               acetaminophen 500 MG tablet  Commonly known as:  TYLENOL  Take 500 mg by mouth 2 (two) times daily.     atorvastatin 20 MG tablet  Commonly known as:  LIPITOR     Calcium-Cholecalciferol 200-250 MG-UNIT Tabs  Take by mouth daily.     EXELON 9.5 mg/24hr  Generic drug:  rivastigmine     metoprolol tartrate 25 MG tablet  Commonly known as:  LOPRESSOR     mirtazapine 15 MG tablet  Commonly known as:  REMERON     NAMENDA XR 14 MG Cp24  Generic drug:  Memantine HCl ER     omeprazole 20 MG capsule  Commonly known as:  PRILOSEC     ondansetron 4 MG tablet  Commonly known as:  ZOFRAN     senna-docusate 8.6-50 MG per tablet  Commonly known as:  Senokot-S  Take 2 tablets by mouth 2 (two) times daily.         Physical Exam: Physical Exam  Constitutional: She appears well-developed and well-nourished. No distress.  HENT:  Head: Normocephalic and atraumatic.  Eyes: EOM are normal. Pupils are equal, round, and reactive to light. Right eye exhibits no discharge. Left eye exhibits no discharge.  Neck: Normal range of motion. Neck supple. No JVD present. No thyromegaly present.  Cardiovascular: Normal rate and regular rhythm.   No murmur heard. Pulmonary/Chest: Effort normal and breath sounds normal. She has no wheezes. She has no rales.  Abdominal: Soft. Bowel sounds are normal. She exhibits no distension. There is no tenderness.  Genitourinary: Rectal exam shows no external hemorrhoid, no internal hemorrhoid, no fissure, no mass and no tenderness. Pelvic exam was performed with patient supine. No labial fusion. There is no rash, tenderness, lesion or  injury on the right labia. There is no rash, tenderness, lesion or injury on the left labia. No erythema, tenderness or bleeding around the vagina. No foreign body around the vagina. No signs of injury around the vagina. No vaginal discharge found.  Lymphadenopathy:    She has no cervical adenopathy.       Right: No inguinal adenopathy present.       Left: No inguinal adenopathy present.  Neurological: She is alert. She displays normal reflexes. No cranial nerve deficit. She exhibits normal muscle tone. Coordination normal.  Skin: Skin is warm and dry. She is not diaphoretic.  Psychiatric:  Her mood appears not anxious. Her affect is not angry, not blunt and not labile. Her speech is delayed and tangential. She is not agitated, not aggressive, not hyperactive, not slowed, not withdrawn, not actively hallucinating and not combative. Thought content is not paranoid and not delusional. Cognition and memory are impaired. She expresses impulsivity and inappropriate judgment. She does not exhibit a depressed mood. She exhibits abnormal recent memory and abnormal remote memory. She is attentive.    Filed Vitals:   12/30/12 1645  BP: 138/82  Pulse: 60  Temp: 98.1 F (36.7 C)  TempSrc: Tympanic  Resp: 18      Labs reviewed: Basic Metabolic Panel:  Recent Labs  16/10/96 06/23/12  NA 140  --   K 4.1  --   BUN 17  --   CREATININE 1.0  --   TSH 2.02 1.00   CBC:  Recent Labs  06/16/12 06/23/12  WBC 7.1 5.7  HGB 12.4 11.2*  HCT 36 34*  PLT 191 265   Past Procedures: 07/06/11 CT abd/pelvis with CM:  IMPRESSION:  1. No acute findings identified within the abdomen or pelvis.  2. Small pulmonary nodules in both lung bases are nonspecific.  The largest measures 5.1 mm. If the patient is at high risk for  bronchogenic carcinoma, follow-up chest CT at 6-12 months is  recommended. If the patient is at low risk for bronchogenic  carcinoma, follow-up chest CT at 12 months is recommended. This   recommendation follows the consensus statement: Guidelines for  Management of Small Pulmonary Nodules Detected on CT Scans: A  Statement from the Fleischner Society as published in Radiology  2005; 237:395-400.  3. T11 compression deformity as demonstrated on MRI from  11/03/2008. This is stable from previous study.    Assessment/Plan Unspecified essential hypertension Controlled on Metoprolol 25mg  bid. 12/2012 128/82, 11/2012 124/80--will have Bp and P log-re-evaluate.             Unspecified constipation Senna S II bid. Stable.             Tachycardia, unspecified Rate controlled on Metoprolol 25mg  bid          GERD (gastroesophageal reflux disease) Stable Omeprazole 20mg  and Zofran 4mg  tid.             Depressive disorder, not elsewhere classified Mirtazapine 15mg --stable           Alzheimer's disease Exelon 9.5mg /24hr daily and Namenda. Wanders, emotional outburst/combative with personal assistance due to lack of understanding of the surroundings. Ttakes Zofran for Exelon related nausea.                 Family/ Staff Communication: observe the patient.   Goals of Care: SNF  Labs/tests ordered: CBC, CMP, TSH pending.

## 2013-01-02 ENCOUNTER — Ambulatory Visit (INDEPENDENT_AMBULATORY_CARE_PROVIDER_SITE_OTHER): Payer: Medicare Other | Admitting: Pediatrics

## 2013-01-02 ENCOUNTER — Encounter: Payer: Self-pay | Admitting: Pediatrics

## 2013-01-02 ENCOUNTER — Encounter: Payer: Self-pay | Admitting: Nurse Practitioner

## 2013-01-02 VITALS — BP 84/60 | HR 108

## 2013-01-02 DIAGNOSIS — R4701 Aphasia: Secondary | ICD-10-CM

## 2013-01-02 DIAGNOSIS — F0280 Dementia in other diseases classified elsewhere without behavioral disturbance: Secondary | ICD-10-CM

## 2013-01-02 DIAGNOSIS — R488 Other symbolic dysfunctions: Secondary | ICD-10-CM

## 2013-01-02 DIAGNOSIS — R269 Unspecified abnormalities of gait and mobility: Secondary | ICD-10-CM

## 2013-01-02 NOTE — Progress Notes (Signed)
This encounter was created in error - please disregard.

## 2013-01-02 NOTE — Progress Notes (Signed)
Patient: Shelby Rodriguez MRN: [id number] Sex: female DOB: Oct 03, 1929  Provider: Deetta Perla, MD Location of Care: [facility]  Note type: Routine return visit  History of Present Illness: Referral Source: Dr. Murray Hodgkins III History from: daughter and [facility] chart Chief Complaint: Alzheimer's Disease  Shelby Rodriguez is a 79 y.o. female who returns evaluation and management of advanced Alzheimer's disease associated with combative behavior.  She returns on January 02, 2013 for the first time since August 22, 2012.    She has advanced Alzheimer's, condition that was diagnosed in March 2007 when she presented with a history of difficulty using language, and problems with memory loss and dizziness.  MRI scan of the brain showed atrophy and small vessel disease.  The patient had mild large vessel atherosclerosis.  Her father had a history of dementia and died at age 6.  Mother had Parkinson disease and may have also had dementia.    Her examination showed mini-mental status of 20/23.  She had difficulty in orientation with serial subtractions spelling the word "world" backwards, poor intermediate recall, difficulty writing a sentence, problems drawing an intersecting parallelogram, and could only name four animals in a minute.  Her neurologic examination was nonfocal.  EEG showed a normal dominant frequency, but diffuse background slowing.  Positron emission tomography showed decreased radiotracer in the temporal lobes and parietal lobes with preservation of signal in the frontal lobes, occipital cortex, and cerebellar cortex, pattern consistent with Alzheimer disease.  She is treated with an Exelon patch, Namenda was later started.  She had a long steady decline.  Her ability to walk has ceased.  She was only able to take a few steps on her last visit.  She had problems with altered mental status beginning around 3 p.m. in the afternoon and continuing until 7 p.m. when she would go  to sleep.  She had declining appetite, but stable weight.  She needed some help with drinking.  She demonstrated frustration and sadness, but had not been combative.    Recently, she has been combative with her caregivers over being moved, personal hygiene, dressing, and eating.  She has been extremely sleepy in the past week.  She refused to eat about two weeks ago and will only drink if fluids are forced upon her.  She is not walking.  She is in diapers.  Her daughter still thinks that she recognizes her, but she is not able to vocalize.  Her decline has occurred rapidly over the past four to six weeks, despite taking Exelon and Namenda.  I was asked to see her today because her daughter has given a great deal of thought to whether or not the cholinesterase inhibitors should be withdrawn and the patient placed under hospice care due to her decline on treatment.  She has been physically healthy.  She has not run fever or had any constitutional signs and symptoms of illness.  I think that she has been moved to the Memory Unit at [facility].  I did not ask about that today.  Review of Systems: 12 system review was remarkable for fatigue, incontinence of bowel and bladder, easy bruising, easy bleeding, feeling cold, aching muscles, memory loss, confusion, weakness, slurred speach, sleepiness, snoring, too much sleep, decreased energy, change in appetite, disinterest in activities.  Past Medical History  Diagnosis Date  . Unspecified disorder of kidney and ureter   . Abdominal pain, periumbilic   . Tachycardia, unspecified   . Sleep disturbance,  unspecified   . Tachycardia, unspecified   . Reflux esophagitis   . Unspecified constipation   . Abnormality of gait   . Unspecified urinary incontinence   . Pathologic fracture of vertebrae   . Fall from other slipping, tripping, or stumbling   . Backache, unspecified   . Emphysematous bleb   . Alcohol-induced persisting amnestic disorder   .  Malignant neoplasm of breast (female), unspecified site   . Depressive disorder, not elsewhere classified   . Diffuse cystic mastopathy   . Disorders of bursae and tendons in shoulder region, unspecified   . Other and unspecified hyperlipidemia   . Macular degeneration (senile) of retina, unspecified   . Unspecified essential hypertension   . Osteoporosis, unspecified   . Dizziness and giddiness   . Other malaise and fatigue   . Urinary frequency   . Pain in joint, lower leg   . Alzheimer's disease    Hospitalizations: no, Head Injury: no, Nervous System Infections: no, Immunizations up to date: yes Past Medical History Comments: See above.  Behavior History patient is combative with attempts to assist with activities of daily living and is unable to communicate her wants and needs.  Surgical History Past Surgical History  Procedure Laterality Date  . Rotator cuff repair    . Mastectomy Bilateral 2001  . Tennis elbow repair    . Aspiration of recurrent breast cysts Bilateral [phone]    Family History family history includes Alzheimer's disease in her father; Parkinsonism in her mother. Family History is negative migraines, seizures, cognitive impairment, blindness, deafness, birth defects, chromosomal disorder, autism.  Social History History   Social History  . Marital Status: Married    Spouse Name: N/A    Number of Children: N/A  . Years of Education: N/A   Social History Main Topics  . Smoking status: Never Smoker   . Smokeless tobacco: Never Used  . Alcohol Use: No  . Drug Use: No  . Sexual Activity: None   Other Topics Concern  . None   Social History Narrative  . None   Living with nursing home. [facility] [place]   No current outpatient prescriptions on file prior to visit.   No current facility-administered medications on file prior to visit.   The medication list was reviewed and reconciled. All changes or newly prescribed medications were  explained.  A complete medication list was provided to the patient/caregiver.  No Known Allergies  Physical Exam BP 84/60  Pulse 108  General: alert, well developed, well-dressed, thin, in no acute distress, right-handed  Head: normocephalic, no dysmorphic features  Ears, Nose and Throat: Otoscopic: tympanic membranes normal; .Pharynx: oropharynx is pink without exudates or tonsillar hypertrophy.  Neck: supple, full range of motion, no cranial or cervical bruits  Respiratory: auscultation clear  Cardiovascular: no murmurs, pulses are normal  Musculoskeletal: no skeletal deformities or apparent scoliosis  Skin: I did not see inflammation of her skin by her patches, skin tents when pinched  Trunk: no deformities in her limbs   Neurologic Exam  Mental Status: awake, lethergic; Patient is unable to name objects or repeat phrases, she smiles when I greet her.  Her dysphasia is nonfluent. She had a flat affect today. She had more trouble following commands than she has in the past. She could only do so by mimic. Cranial Nerves: visual fields are full to double simultaneous stimuli; extraocular movements are full and conjugate; pupils are round reactive to light; funduscopic examination shows sharp disc margins  with normal vessels; right eyelid ptosis, symmetric facial strength; midline tongue and uvula; air conduction is greater than bone conduction bilaterally.  Motor: Normal functional strength, tone, and mass; apraxic motor movements; no pronator drift.  Sensory: intact responses to noxious stimuli  Coordination: good finger-to-nose, slow rapid repetitive alternating movements and poor finger apposition  Gait and Station: gait not tested today; she was sitting in the wheelchair and is unable to walk walk without assistance, she is unable to propel the wheelchair with her feet  Reflexes: symmetric, brisk at the knees and biceps and diminished bilaterally elsewhere; no clonus; bilateral flexor  plantar responses. no frontal release signs were seen  Assessment 1. Alzheimer's disease, 331.0. 2. Aphasia, 783.3. 3. Gait abnormality, 781.2. 4. 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  Discussion The patient has entered the severe stage of Alzheimer's.  She is unable to care for herself.  She is anorexic.  She has become combative for activities of daily living, which is new.  Her quality of life has precipitously declined.  I agree with her daughter that it is not unreasonable to withdraw Exelon and Namenda.  Because of the hypotension that I saw today, I think that she should also be taken off metoprolol.  There is no reason to continue atorvastatin.  There may be a reason to continue omeprazole because she has had problems with her recurrent vomiting.  I suspect when Exelon is withdrawn, that that will not be an issue.  I also expect that when Exelon and Namenda are withdrawn, that we will see the pace of decline further accelerate.  In my opinion under the current circumstances, she has less than six months to live, probably considerably less.  I would continue to offer her food and fluids, but I would not force them on her.    My greatest concern is that she is at risk for falling and injuring herself, which will add significant pain to her overall neurological decline as she continues to have diminished oral intake, we will start to see skin breakdown in areas where she bears weight.  Forcing oral intake is not going to improve the quality of her life; in fact it will worsen her combativeness.  Medications like Seroquel can be useful for agitation, however, I suspect if she is just allowed comfort means for care that the combativeness will decline.    This is going to be very difficult for her husband and son to accept, but neither have been involved intimately in her care as her daughter has.  I believe that this is an ethically sound decision based on the medical facts and I am supportive.  I  will be available to meet with the family as needed.    Her daughter would like her transfer to the hospice unit in [place], a city where she lives.  Her major concern is to be at her mother's side in her declining time and prior to death.  I have written recommendations to discontinue medications and will send this to Dr. Murray Hodgkins who is her primary physician at [facility] .  I will be available to discuss this with him as well.  I spent 40 minutes of face-to-face time with the patient, her daughter, and son-in-law, three-quarters of it in consultation.  Deetta Perla MD

## 2013-01-02 NOTE — Patient Instructions (Addendum)
I would recommend discontinuing Exelon, Namenda, metoprolol, and atorvastatin.  Consider discontinuing omeprazole unless vomiting persists.  Continue medicines that are needed for comfort.  I would recommend placing her on hospice status.  I would not force food and would allow her to eat and drink liquids if she wishes to do so.  Medications that may be useful for her combativeness would include Seroquel, haloperidol,and Risperdal.

## 2013-01-13 ENCOUNTER — Encounter: Payer: Self-pay | Admitting: Nurse Practitioner

## 2013-01-13 ENCOUNTER — Non-Acute Institutional Stay (SKILLED_NURSING_FACILITY): Payer: Medicare Other | Admitting: Nurse Practitioner

## 2013-01-13 DIAGNOSIS — R000 Tachycardia, unspecified: Secondary | ICD-10-CM

## 2013-01-13 DIAGNOSIS — K219 Gastro-esophageal reflux disease without esophagitis: Secondary | ICD-10-CM

## 2013-01-13 DIAGNOSIS — R627 Adult failure to thrive: Secondary | ICD-10-CM | POA: Insufficient documentation

## 2013-01-13 DIAGNOSIS — F0280 Dementia in other diseases classified elsewhere without behavioral disturbance: Secondary | ICD-10-CM

## 2013-01-13 DIAGNOSIS — K5900 Constipation, unspecified: Secondary | ICD-10-CM

## 2013-01-13 NOTE — Assessment & Plan Note (Signed)
Continue with prn Zofran ODT available to her.

## 2013-01-13 NOTE — Assessment & Plan Note (Signed)
End stage, unable to take oral meds or food today, dc all po meds. Will have Morphine, Ativan, Zofran, Bisacodyl PR prn available to her. Continue Comfort measures and Hospice Service.

## 2013-01-13 NOTE — Assessment & Plan Note (Signed)
Will have prn Bisacodyl suppository available to her if no BM>3 days.

## 2013-01-13 NOTE — Assessment & Plan Note (Signed)
persists

## 2013-01-13 NOTE — Assessment & Plan Note (Signed)
Worse, HR 120s since unable to take oral medications.

## 2013-01-13 NOTE — Progress Notes (Signed)
Patient ID: Shelby Rodriguez, female   DOB: 1929-04-05, 79 y.o.   MRN: [id number]  Code Status: DNR  No Known Allergies  Chief Complaint  Patient presents with  . Medical Managment of Chronic Issues    end of life care    HPI: Patient is a 79 y.o. female seen in the SNF at [facility] today for evaluation of end of life care, tachycardia, constipation.      Problem List Items Addressed This Visit   Adult failure to thrive - Primary     persists    Alzheimer's disease     End stage, unable to take oral meds or food today, dc all po meds. Will have Morphine, Ativan, Zofran, Bisacodyl PR prn available to her. Continue Comfort measures and Hospice Service.     Relevant Medications      LORazepam (ATIVAN) 1 MG tablet   GERD (gastroesophageal reflux disease)     Continue with prn Zofran ODT available to her.     Relevant Medications      ondansetron (ZOFRAN-ODT) 4 MG disintegrating tablet      bisacodyl (DULCOLAX) 10 MG suppository   Tachycardia, unspecified     Worse, HR 120s since unable to take oral medications.    Unspecified constipation     Will have prn Bisacodyl suppository available to her if no BM>3 days.        Review of Systems:  Review of Systems  Constitutional: Positive for weight loss and malaise/fatigue.  HENT: Negative for congestion and hearing loss.   Respiratory: Negative for cough, sputum production, shortness of breath and wheezing.   Cardiovascular: Negative for PND.       Tachycardia  Gastrointestinal: Positive for nausea (has been better since Zofran 4mg  with meals. ). Negative for constipation.  Genitourinary: Positive for frequency (incontinent of bladder. ). Negative for flank pain.  Musculoskeletal: Positive for joint pain.       Per family.   Skin: Negative for rash.  Neurological: Positive for loss of consciousness. Negative for headaches.       Agitation.   Psychiatric/Behavioral: Positive for memory loss. Negative for depression and  hallucinations. The patient is not nervous/anxious and does not have insomnia.        Restlessness and agitation.      Past Medical History  Diagnosis Date  . Unspecified disorder of kidney and ureter   . Abdominal pain, periumbilic   . Tachycardia, unspecified   . Sleep disturbance, unspecified   . Tachycardia, unspecified   . Reflux esophagitis   . Unspecified constipation   . Abnormality of gait   . Unspecified urinary incontinence   . Pathologic fracture of vertebrae   . Fall from other slipping, tripping, or stumbling   . Backache, unspecified   . Emphysematous bleb   . Alcohol-induced persisting amnestic disorder   . Malignant neoplasm of breast (female), unspecified site   . Depressive disorder, not elsewhere classified   . Diffuse cystic mastopathy   . Disorders of bursae and tendons in shoulder region, unspecified   . Other and unspecified hyperlipidemia   . Macular degeneration (senile) of retina, unspecified   . Unspecified essential hypertension   . Osteoporosis, unspecified   . Dizziness and giddiness   . Other malaise and fatigue   . Urinary frequency   . Pain in joint, lower leg   . Alzheimer's disease    Past Surgical History  Procedure Laterality Date  . Rotator cuff repair    .  Mastectomy Bilateral 2001  . Tennis elbow repair    . Aspiration of recurrent breast cysts Bilateral [phone]   Social History:   reports that she has never smoked. She has never used smokeless tobacco. She reports that she does not drink alcohol or use illicit drugs.  Family History  Problem Relation Age of Onset  . Alzheimer's disease Father     Died at 74  . Parkinsonism Mother     Died at 42    Medications:   Medication List       This list is accurate as of: 01/13/13  4:42 PM.  Always use your most recent med list.               bisacodyl 10 MG suppository  Commonly known as:  DULCOLAX  Place 10 mg rectally as needed for constipation.     LORazepam 1  MG tablet  Commonly known as:  ATIVAN  Take 1 mg by mouth every 2 (two) hours as needed for anxiety. topical     morphine 20 MG/ML concentrated solution  Commonly known as:  ROXANOL  Take 5 mg by mouth every 2 (two) hours as needed for pain.     ondansetron 4 MG disintegrating tablet  Commonly known as:  ZOFRAN-ODT  Take 4 mg by mouth every 4 (four) hours as needed for nausea.         Physical Exam: Physical Exam  Constitutional: She appears well-developed and well-nourished. No distress.  HENT:  Head: Normocephalic and atraumatic.  Eyes: EOM are normal. Pupils are equal, round, and reactive to light. Right eye exhibits no discharge. Left eye exhibits no discharge.  Neck: Normal range of motion. Neck supple. No JVD present. No thyromegaly present.  Cardiovascular: Regular rhythm.   No murmur heard. HR 120 at rest.   Pulmonary/Chest: Effort normal and breath sounds normal. She has no wheezes. She has no rales.  Abdominal: Soft. Bowel sounds are normal. She exhibits no distension. There is no tenderness.  Genitourinary: Rectal exam shows no external hemorrhoid, no internal hemorrhoid, no fissure, no mass and no tenderness. Pelvic exam was performed with patient supine. No labial fusion. There is no rash, tenderness, lesion or injury on the right labia. There is no rash, tenderness, lesion or injury on the left labia. No erythema, tenderness or bleeding around the vagina. No foreign body around the vagina. No signs of injury around the vagina. No vaginal discharge found.  Lymphadenopathy:    She has no cervical adenopathy.       Right: No inguinal adenopathy present.       Left: No inguinal adenopathy present.  Neurological: She is alert. She displays normal reflexes. No cranial nerve deficit. She exhibits normal muscle tone. Coordination normal.  Skin: Skin is warm and dry. She is not diaphoretic.  Psychiatric: Her mood appears not anxious. Her affect is not angry. Her speech is  delayed and tangential. She is agitated and combative. She is not aggressive, not hyperactive, not slowed, not withdrawn and not actively hallucinating. Thought content is not paranoid and not delusional. Cognition and memory are impaired. She expresses impulsivity and inappropriate judgment. She does not exhibit a depressed mood. She is noncommunicative. She exhibits abnormal recent memory and abnormal remote memory. She is attentive.    Filed Vitals:   01/13/13 1620  BP: 119/81  Pulse: 117  Temp: 98.4 F (36.9 C)  TempSrc: Tympanic  Resp: 24      Labs reviewed: Basic Metabolic Panel:  Recent Labs  06/16/12 06/23/12  NA 140  --   K 4.1  --   BUN 17  --   CREATININE 1.0  --   TSH 2.02 1.00   CBC:  Recent Labs  06/16/12 06/23/12  WBC 7.1 5.7  HGB 12.4 11.2*  HCT 36 34*  PLT 191 265   Past Procedures: 07/06/11 CT abd/pelvis with CM:  IMPRESSION:  1. No acute findings identified within the abdomen or pelvis.  2. Small pulmonary nodules in both lung bases are nonspecific.  The largest measures 5.1 mm. If the patient is at high risk for  bronchogenic carcinoma, follow-up chest CT at 6-12 months is  recommended. If the patient is at low risk for bronchogenic  carcinoma, follow-up chest CT at 12 months is recommended. This  recommendation follows the consensus statement: Guidelines for  Management of Small Pulmonary Nodules Detected on CT Scans: A  Statement from the Fleischner Society as published in Radiology  2005; 237:395-400.  3. T11 compression deformity as demonstrated on MRI from  11/03/2008. This is stable from previous study.    Assessment/Plan Alzheimer's disease End stage, unable to take oral meds or food today, dc all po meds. Will have Morphine, Ativan, Zofran, Bisacodyl PR prn available to her. Continue Comfort measures and Hospice Service.   GERD (gastroesophageal reflux disease) Continue with prn Zofran ODT available to her.   Adult failure to  thrive persists  Tachycardia, unspecified Worse, HR 120s since unable to take oral medications.  Unspecified constipation Will have prn Bisacodyl suppository available to her if no BM>3 days.     Family/ Staff Communication: observe the patient.   Goals of Care: SNF. Hospice  Labs/tests ordered: none

## 2013-01-30 ENCOUNTER — Ambulatory Visit: Payer: Medicare Other | Admitting: Pediatrics

## 2013-02-16 DEATH — deceased

## 2014-03-14 IMAGING — CT CT ABD-PELV W/ CM
3 of 5 series · 12 of 36 positions shown, 18 images · IV contrast (READICAT/WATER & [ID] OMNI 300)
Comparison: None

CLINICAL DATA: Abdominal pain and constipation

CT ABDOMEN AND PELVIS WITH CONTRAST
TECHNIQUE: Multidetector CT imaging of the abdomen and pelvis was
performed following the standard protocol during bolus
administration of intravenous contrast.
Contrast: 100mL OMNIPAQUE IOHEXOL 300 MG/ML  SOLN

[Series 3: abd/pelvis with · axial · 0.67mm/px · z∈[-346,-46]mm · 7 of 81 slices shown, 12 images]
[im 11/81  soft-tissue]
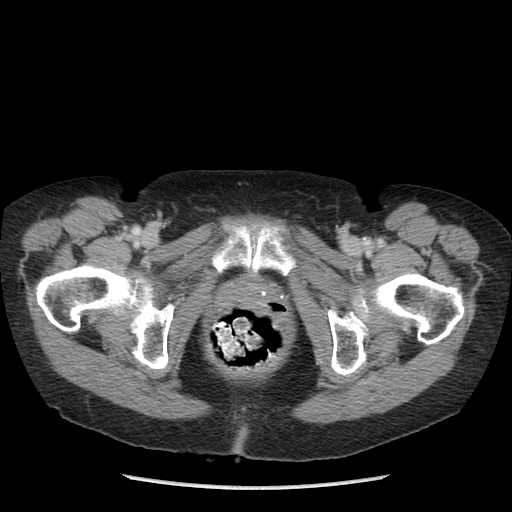
[im 11/81  bone]
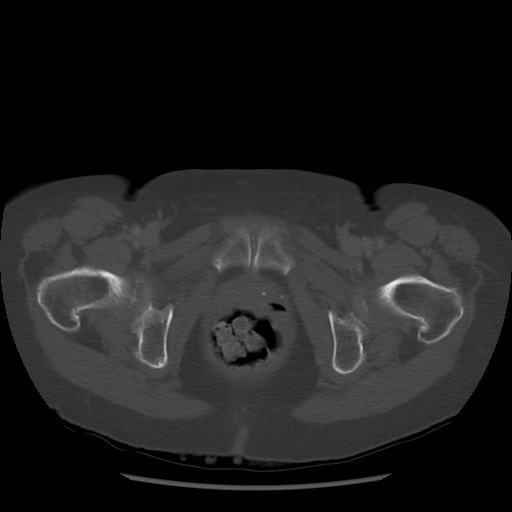
[im 21/81  soft-tissue]
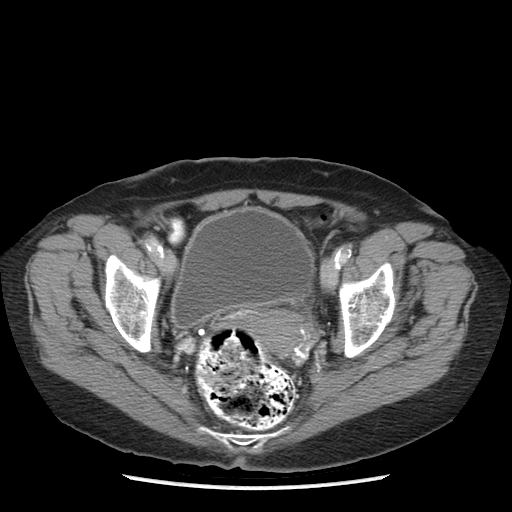
[im 31/81  soft-tissue]
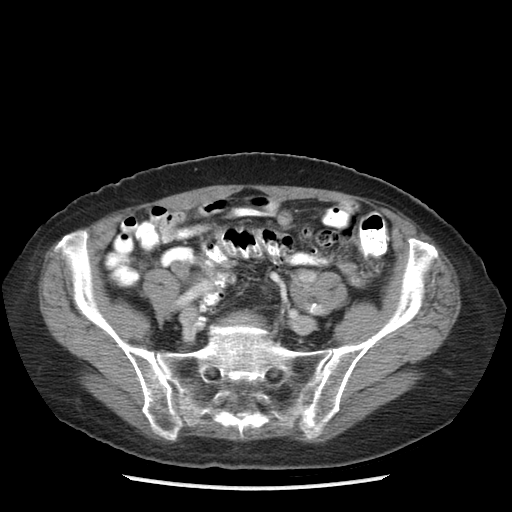
[im 41/81  soft-tissue]
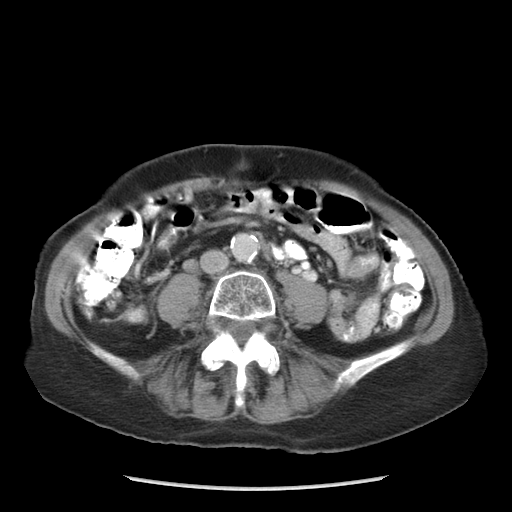
[im 41/81  lung]
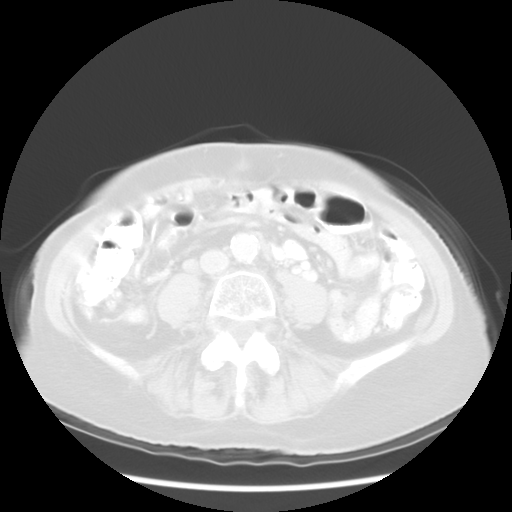
[im 51/81  soft-tissue]
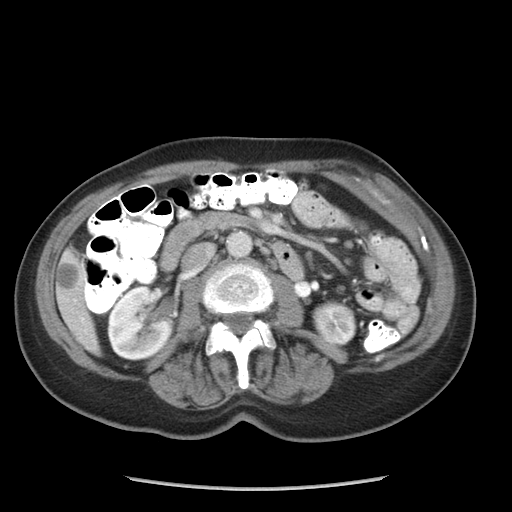
[im 51/81  lung]
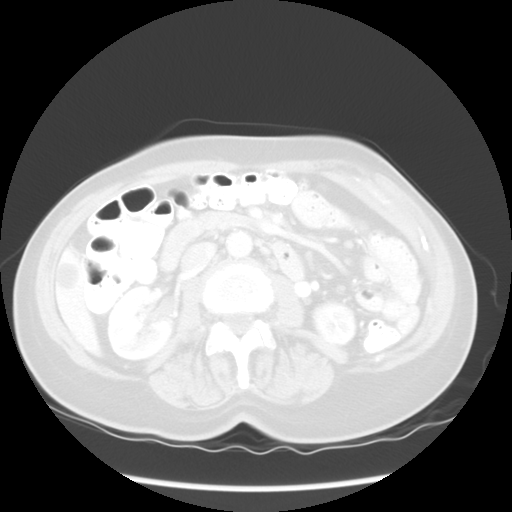
[im 61/81  soft-tissue]
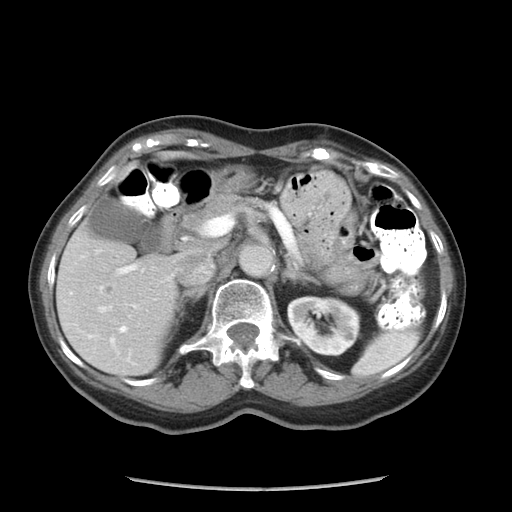
[im 61/81  lung]
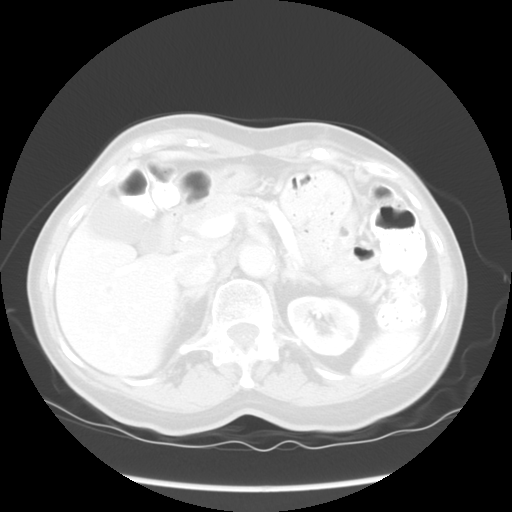
[im 71/81  soft-tissue]
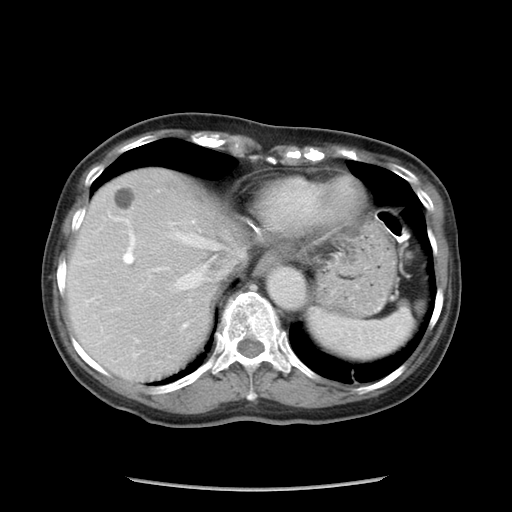
[im 71/81  lung]
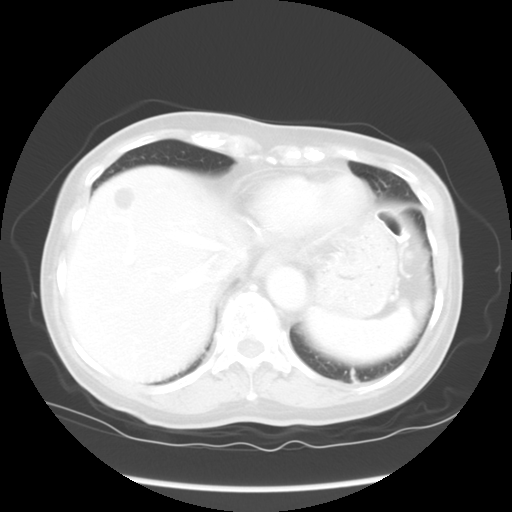

[Series 601: coronal body · coronal · 0.80mm/px · 1 of 101 slices shown, 2 images]
[im 34/101  soft-tissue]
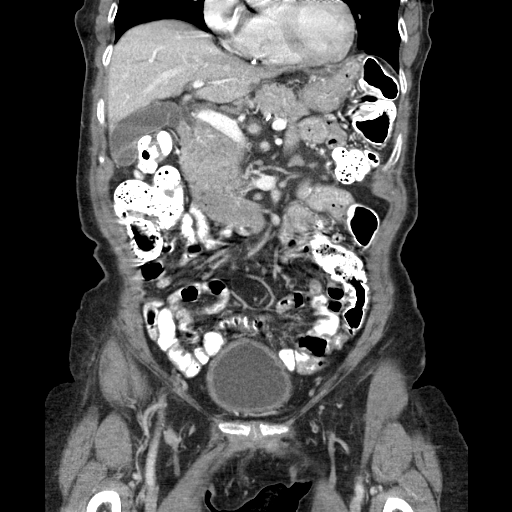
[im 34/101  bone]
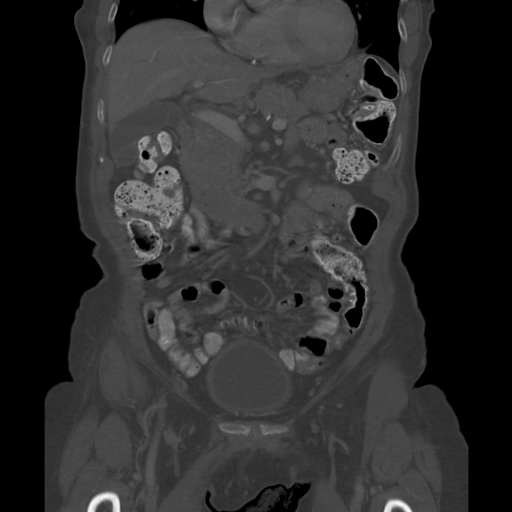

[Series 602: sagittal body · sagittal · 0.80mm/px · 4 of 138 slices shown]
[im 9/138  soft-tissue]
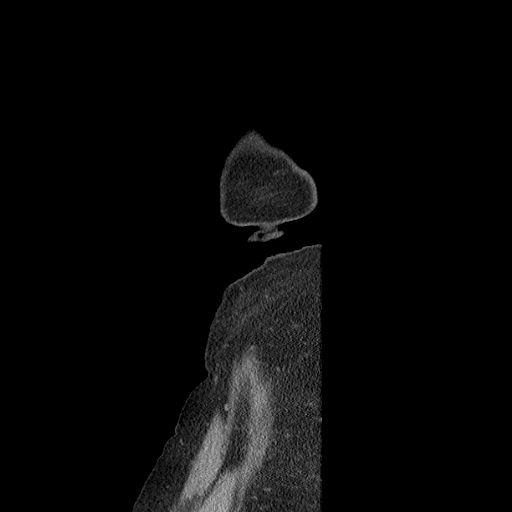
[im 26/138  soft-tissue]
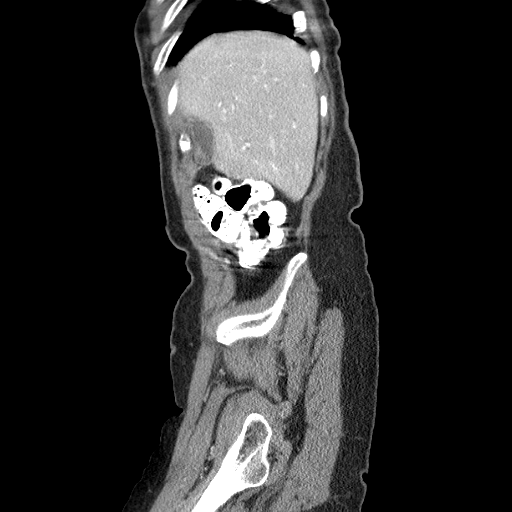
[im 43/138  soft-tissue]
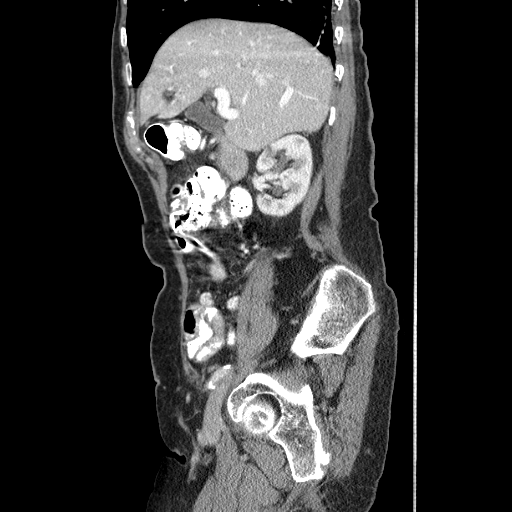
[im 60/138  soft-tissue]
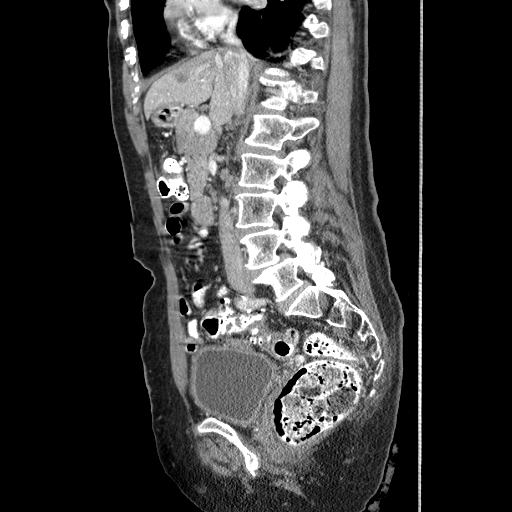

[12 of 36 positions shown; findings below may reference images not displayed]

FINDINGS: There is plate-like atelectasis within the lung bases.
Within the right middle lobe there is a pulmonary nodule which
measures 0.5 cm, image #4.

Lingular nodule measures 0.4 cm, image #6.

Within the left base there is a 0.5 cm pulmonary nodule.

Multiple cysts are again noted within the liver parenchyma.

The gallbladder appears normal.  There is no biliary dilatation.
The pancreas appears unremarkable.

Normal appearance of the spleen.

The adrenal glands are normal.

Small bilateral renal hypodensities are identified which likely
represents cysts.  No obstructive uropathy.

The urinary bladder has a normal appearance

Uterus and the adnexal structures have a normal appearance for
patient's age.

No adenopathy identified within the upper abdomen.  There is
calcified atherosclerotic disease affecting the abdominal aorta and
its branches.

The stomach appears within normal limits.

The small bowel loops are also unremarkable.  The proximal colon
appears normal.

There is a moderate amount of stool identified within the rectum.
Review of the visualized bony structures shows osteopenia.  There
is an anterolisthesis of L4 on L5 measuring 6 mm.  T11 compression
deformity is identified.  No discrete mass is noted.
IMPRESSION: 1.  No acute findings identified within the abdomen or pelvis.
2.  Small pulmonary nodules in both lung bases are nonspecific.
The largest measures 5.1 mm. If the patient is at high risk for
bronchogenic carcinoma, follow-up chest CT at 6-12 months is
recommended.  If the patient is at low risk for bronchogenic
carcinoma, follow-up chest CT at 12 months is recommended.  This
recommendation follows the consensus statement: Guidelines for
Management of Small Pulmonary Nodules Detected on CT Scans: A
Statement from the [HOSPITAL] as published in Radiology
3.  T11 compression deformity as demonstrated on MRI from
11/03/2008.  This is stable from previous study.

## 2015-01-27 NOTE — Progress Notes (Signed)
This encounter was created in error - please disregard.
